# Patient Record
Sex: Male | Born: 1965 | Hispanic: No | Marital: Married | Smoking: Former smoker
Health system: Southern US, Community
[De-identification: ages and names within clinical notes are randomized; demographics above are authoritative.]

## PROBLEM LIST (undated history)

## (undated) DIAGNOSIS — K649 Unspecified hemorrhoids: Secondary | ICD-10-CM

## (undated) DIAGNOSIS — K635 Polyp of colon: Secondary | ICD-10-CM

## (undated) DIAGNOSIS — M1990 Unspecified osteoarthritis, unspecified site: Secondary | ICD-10-CM

## (undated) HISTORY — DX: Polyp of colon: K63.5

## (undated) HISTORY — DX: Unspecified hemorrhoids: K64.9

---

## 2005-12-09 HISTORY — PX: BACK SURGERY: SHX140

## 2019-11-17 LAB — HEPATIC FUNCTION PANEL
ALT: 29 (ref 10–40)
AST: 29 (ref 14–40)
Alkaline Phosphatase: 54 (ref 25–125)
Bilirubin, Total: 0.4

## 2019-11-17 LAB — BASIC METABOLIC PANEL
BUN: 33 — AB (ref 4–21)
CO2: 28 — AB (ref 13–22)
Chloride: 103 (ref 99–108)
Creatinine: 0.9 (ref 0.6–1.3)
Glucose: 99
Potassium: 4.1 (ref 3.4–5.3)
Sodium: 138 (ref 137–147)

## 2019-11-17 LAB — TESTOSTERONE: Testosterone: 601

## 2019-11-17 LAB — CBC AND DIFFERENTIAL
HCT: 38 — AB (ref 41–53)
Hemoglobin: 13.1 — AB (ref 13.5–17.5)
Neutrophils Absolute: 5
Platelets: 259 (ref 150–399)
WBC: 7

## 2019-11-17 LAB — COMPREHENSIVE METABOLIC PANEL
Albumin: 4.1 (ref 3.5–5.0)
Calcium: 9.1 (ref 8.7–10.7)

## 2019-11-17 LAB — CBC: RBC: 40.5 — AB (ref 3.87–5.11)

## 2020-02-25 ENCOUNTER — Ambulatory Visit: Payer: Self-pay | Admitting: Family Medicine

## 2020-03-06 ENCOUNTER — Other Ambulatory Visit: Payer: Self-pay

## 2020-03-07 ENCOUNTER — Encounter: Payer: Self-pay | Admitting: Family Medicine

## 2020-03-07 ENCOUNTER — Encounter: Payer: Self-pay | Admitting: Gastroenterology

## 2020-03-07 ENCOUNTER — Ambulatory Visit (INDEPENDENT_AMBULATORY_CARE_PROVIDER_SITE_OTHER): Payer: 59 | Admitting: Family Medicine

## 2020-03-07 VITALS — BP 118/70 | HR 76 | Temp 97.4°F | Ht 70.0 in | Wt 213.4 lb

## 2020-03-07 DIAGNOSIS — Z0000 Encounter for general adult medical examination without abnormal findings: Secondary | ICD-10-CM | POA: Diagnosis not present

## 2020-03-07 DIAGNOSIS — N529 Male erectile dysfunction, unspecified: Secondary | ICD-10-CM | POA: Insufficient documentation

## 2020-03-07 DIAGNOSIS — E291 Testicular hypofunction: Secondary | ICD-10-CM

## 2020-03-07 DIAGNOSIS — N5239 Other post-surgical erectile dysfunction: Secondary | ICD-10-CM

## 2020-03-07 DIAGNOSIS — R150 Incomplete defecation: Secondary | ICD-10-CM | POA: Diagnosis not present

## 2020-03-07 DIAGNOSIS — K649 Unspecified hemorrhoids: Secondary | ICD-10-CM | POA: Diagnosis not present

## 2020-03-07 DIAGNOSIS — Z1211 Encounter for screening for malignant neoplasm of colon: Secondary | ICD-10-CM | POA: Insufficient documentation

## 2020-03-07 MED ORDER — SILDENAFIL CITRATE 100 MG PO TABS: 50.0000 mg | ORAL_TABLET | Freq: Every day | ORAL | 5 refills | Status: DC | PRN

## 2020-03-07 MED ORDER — SILDENAFIL CITRATE 100 MG PO TABS: 50.0000 mg | ORAL_TABLET | Freq: Every day | ORAL | 11 refills | Status: DC | PRN

## 2020-03-07 NOTE — Addendum Note (Signed)
Addended by: Lynda Rainwater on: 03/07/2020 02:07 PM   Modules accepted: Orders

## 2020-03-07 NOTE — Patient Instructions (Signed)
Health Maintenance, Male Adopting a healthy lifestyle and getting preventive care are important in promoting health and wellness. Ask your health care provider about:  The right schedule for you to have regular tests and exams.  Things you can do on your own to prevent diseases and keep yourself healthy. What should I know about diet, weight, and exercise? Eat a healthy diet   Eat a diet that includes plenty of vegetables, fruits, low-fat dairy products, and lean protein.  Do not eat a lot of foods that are high in solid fats, added sugars, or sodium. Maintain a healthy weight Body mass index (BMI) is a measurement that can be used to identify possible weight problems. It estimates body fat based on height and weight. Your health care provider can help determine your BMI and help you achieve or maintain a healthy weight. Get regular exercise Get regular exercise. This is one of the most important things you can do for your health. Most adults should:  Exercise for at least 150 minutes each week. The exercise should increase your heart rate and make you sweat (moderate-intensity exercise).  Do strengthening exercises at least twice a week. This is in addition to the moderate-intensity exercise.  Spend less time sitting. Even light physical activity can be beneficial. Watch cholesterol and blood lipids Have your blood tested for lipids and cholesterol at 56 years of age, then have this test every 5 years. You may need to have your cholesterol levels checked more often if:  Your lipid or cholesterol levels are high.  You are older than 56 years of age.  You are at high risk for heart disease. What should I know about cancer screening? Many types of cancers can be detected early and may often be prevented. Depending on your health history and family history, you may need to have cancer screening at various ages. This may include screening for:  Colorectal cancer.  Prostate  cancer.  Skin cancer.  Lung cancer. What should I know about heart disease, diabetes, and high blood pressure? Blood pressure and heart disease  High blood pressure causes heart disease and increases the risk of stroke. This is more likely to develop in people who have high blood pressure readings, are of African descent, or are overweight.  Talk with your health care provider about your target blood pressure readings.  Have your blood pressure checked: ? Every 3-5 years if you are 18-39 years of age. ? Every year if you are 40 years old or older.  If you are between the ages of 65 and 75 and are a current or former smoker, ask your health care provider if you should have a one-time screening for abdominal aortic aneurysm (AAA). Diabetes Have regular diabetes screenings. This checks your fasting blood sugar level. Have the screening done:  Once every three years after age 45 if you are at a normal weight and have a low risk for diabetes.  More often and at a younger age if you are overweight or have a high risk for diabetes. What should I know about preventing infection? Hepatitis B If you have a higher risk for hepatitis B, you should be screened for this virus. Talk with your health care provider to find out if you are at risk for hepatitis B infection. Hepatitis C Blood testing is recommended for:  Everyone born from 1945 through 1965.  Anyone with known risk factors for hepatitis C. Sexually transmitted infections (STIs)  You should be screened each year   for STIs, including gonorrhea and chlamydia, if: ? You are sexually active and are younger than 56 years of age. ? You are older than 56 years of age and your health care provider tells you that you are at risk for this type of infection. ? Your sexual activity has changed since you were last screened, and you are at increased risk for chlamydia or gonorrhea. Ask your health care provider if you are at risk.  Ask your  health care provider about whether you are at high risk for HIV. Your health care provider may recommend a prescription medicine to help prevent HIV infection. If you choose to take medicine to prevent HIV, you should first get tested for HIV. You should then be tested every 3 months for as long as you are taking the medicine. Follow these instructions at home: Lifestyle  Do not use any products that contain nicotine or tobacco, such as cigarettes, e-cigarettes, and chewing tobacco. If you need help quitting, ask your health care provider.  Do not use street drugs.  Do not share needles.  Ask your health care provider for help if you need support or information about quitting drugs. Alcohol use  Do not drink alcohol if your health care provider tells you not to drink.  If you drink alcohol: ? Limit how much you have to 0-2 drinks a day. ? Be aware of how much alcohol is in your drink. In the [place] one drink equals one 12 oz bottle of beer (355 mL), one 5 oz glass of wine (148 mL), or one 1 oz glass of hard liquor (44 mL). General instructions  Schedule regular health, dental, and eye exams.  Stay current with your vaccines.  Tell your health care provider if: ? You often feel depressed. ? You have ever been abused or do not feel safe at home. Summary  Adopting a healthy lifestyle and getting preventive care are important in promoting health and wellness.  Follow your health care provider's instructions about healthy diet, exercising, and getting tested or screened for diseases.  Follow your health care provider's instructions on monitoring your cholesterol and blood pressure. This information is not intended to replace advice given to you by your health care provider. Make sure you discuss any questions you have with your health care provider. Document Revised: 11/18/2018 Document Reviewed: 11/18/2018 Elsevier Patient Education  2020 Elsevier Inc.  Preventive Care 40-64 Years  Old, Male Preventive care refers to lifestyle choices and visits with your health care provider that can promote health and wellness. This includes:  A yearly physical exam. This is also called an annual well check.  Regular dental and eye exams.  Immunizations.  Screening for certain conditions.  Healthy lifestyle choices, such as eating a healthy diet, getting regular exercise, not using drugs or products that contain nicotine and tobacco, and limiting alcohol use. What can I expect for my preventive care visit? Physical exam Your health care provider will check:  Height and weight. These may be used to calculate body mass index (BMI), which is a measurement that tells if you are at a healthy weight.  Heart rate and blood pressure.  Your skin for abnormal spots. Counseling Your health care provider may ask you questions about:  Alcohol, tobacco, and drug use.  Emotional well-being.  Home and relationship well-being.  Sexual activity.  Eating habits.  Work and work environment. What immunizations do I need?  Influenza (flu) vaccine  This is recommended every year. Tetanus, diphtheria,   and pertussis (Tdap) vaccine  You may need a Td booster every 10 years. Varicella (chickenpox) vaccine  You may need this vaccine if you have not already been vaccinated. Zoster (shingles) vaccine  You may need this after age 63. Measles, mumps, and rubella (MMR) vaccine  You may need at least one dose of MMR if you were born in 1957 or later. You may also need a second dose. Pneumococcal conjugate (PCV13) vaccine  You may need this if you have certain conditions and were not previously vaccinated. Pneumococcal polysaccharide (PPSV23) vaccine  You may need one or two doses if you smoke cigarettes or if you have certain conditions. Meningococcal conjugate (MenACWY) vaccine  You may need this if you have certain conditions. Hepatitis A vaccine  You may need this if you have  certain conditions or if you travel or work in places where you may be exposed to hepatitis A. Hepatitis B vaccine  You may need this if you have certain conditions or if you travel or work in places where you may be exposed to hepatitis B. Haemophilus influenzae type b (Hib) vaccine  You may need this if you have certain risk factors. Human papillomavirus (HPV) vaccine  If recommended by your health care provider, you may need three doses over 6 months. You may receive vaccines as individual doses or as more than one vaccine together in one shot (combination vaccines). Talk with your health care provider about the risks and benefits of combination vaccines. What tests do I need? Blood tests  Lipid and cholesterol levels. These may be checked every 5 years, or more frequently if you are over 68 years old.  Hepatitis C test.  Hepatitis B test. Screening  Lung cancer screening. You may have this screening every year starting at age 78 if you have a 30-pack-year history of smoking and currently smoke or have quit within the past 15 years.  Prostate cancer screening. Recommendations will vary depending on your family history and other risks.  Colorectal cancer screening. All adults should have this screening starting at age 38 and continuing until age 22. Your health care provider may recommend screening at age 73 if you are at increased risk. You will have tests every 1-10 years, depending on your results and the type of screening test.  Diabetes screening. This is done by checking your blood sugar (glucose) after you have not eaten for a while (fasting). You may have this done every 1-3 years.  Sexually transmitted disease (STD) testing. Follow these instructions at home: Eating and drinking  Eat a diet that includes fresh fruits and vegetables, whole grains, lean protein, and low-fat dairy products.  Take vitamin and mineral supplements as recommended by your health care  provider.  Do not drink alcohol if your health care provider tells you not to drink.  If you drink alcohol: ? Limit how much you have to 0-2 drinks a day. ? Be aware of how much alcohol is in your drink. In the [place] one drink equals one 12 oz bottle of beer (355 mL), one 5 oz glass of wine (148 mL), or one 1 oz glass of hard liquor (44 mL). Lifestyle  Take daily care of your teeth and gums.  Stay active. Exercise for at least 30 minutes on 5 or more days each week.  Do not use any products that contain nicotine or tobacco, such as cigarettes, e-cigarettes, and chewing tobacco. If you need help quitting, ask your health care provider.  If  you are sexually active, practice safe sex. Use a condom or other form of protection to prevent STIs (sexually transmitted infections).  Talk with your health care provider about taking a low-dose aspirin every day starting at age 50. What's next?  Go to your health care provider once a year for a well check visit.  Ask your health care provider how often you should have your eyes and teeth checked.  Stay up to date on all vaccines. This information is not intended to replace advice given to you by your health care provider. Make sure you discuss any questions you have with your health care provider. Document Revised: 11/19/2018 Document Reviewed: 11/19/2018 Elsevier Patient Education  2020 Elsevier Inc.  

## 2020-03-07 NOTE — Progress Notes (Signed)
New Patient Office Visit  Subjective:  Patient ID: Tyler Ferguson, male    DOB: [birth year]  Age: 54 y.o. MRN: [id number]  CC:  Chief Complaint  Patient presents with  . Establish Care    new patient refill on viagra     HPI Tyler Ferguson presents for establishment of care and a physical exam.  He is nonfasting.  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  Tells of difficulty with stooling and fecal incontinence with erectile dysfunction after back surgery some years ago.  He deals with leakage from his rectum.  It often takes him 30 minutes to have a bowel movement.  He is taking nothing for this issue.  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  History reviewed. No pertinent past medical history.  Past Surgical History:  Procedure Laterality Date  . BACK SURGERY  2007    Family History  Problem Relation Age of Onset  . Heart disease Mother   . Cancer Father   . Cancer Sister     Social History   Socioeconomic History  . Marital status: Married    Spouse name: Not on file  . Number of children: Not on file  . Years of education: Not on file  . Highest education level: Not on file  Occupational History  . Not on file  Tobacco Use  . Smoking status: Former Research scientist (life sciences)  . Smokeless tobacco: Never Used  Substance and Sexual Activity  . Alcohol use: Yes    Alcohol/week: 1.0 standard drinks    Types: 1 Cans of beer per week    Comment: social  . Drug use: Never  . Sexual activity: Not on file  Other Topics Concern  . Not on file  Social History Narrative  . Not on file   Social Determinants of Health   Financial Resource Strain:   . Difficulty of Paying Living Expenses:   Food Insecurity:   . Worried About Charity fundraiser in the Last Year:   . Arboriculturist in the Last Year:   Transportation Needs:   . Film/video editor (Medical):   Marland Kitchen Lack of Transportation (Non-Medical):   Physical Activity:   . Days of  Exercise per Week:   . Minutes of Exercise per Session:   Stress:   . Feeling of Stress :   Social Connections:   . Frequency of Communication with Friends and Family:   . Frequency of Social Gatherings with Friends and Family:   . Attends Religious Services:   . Active Member of Clubs or Organizations:   . Attends Archivist Meetings:   Marland Kitchen Marital Status:   Intimate Partner Violence:   . Fear of Current or Ex-Partner:   . Emotionally Abused:   Marland Kitchen Physically Abused:   . Sexually Abused:     ROS Review of Systems  Constitutional: Negative.   HENT: Negative.   Eyes: Negative for photophobia and visual disturbance.  Respiratory: Negative.   Cardiovascular: Negative.   Gastrointestinal: Negative.  Negative for anal bleeding and blood in stool.  Endocrine: Negative for polyphagia and polyuria.  Genitourinary: Negative for difficulty urinating, frequency and urgency.  Musculoskeletal: Negative for gait problem and joint swelling.  Skin: Negative for pallor and rash.  Allergic/Immunologic: Negative for immunocompromised state.  Neurological: Negative for speech difficulty.  Hematological: Does not bruise/bleed easily.  Psychiatric/Behavioral: Negative.     Objective:   Today's Vitals: BP 118/70  Pulse 76   Temp (!) 97.4 F (36.3 C) (Tympanic)   Ht 5\' 10"  (1.778 m)   Wt 213 lb 6.4 oz (96.8 kg)   SpO2 94%   BMI 30.62 kg/m   Physical Exam Vitals and nursing note reviewed.  Constitutional:      General: He is not in acute distress.    Appearance: Normal appearance. He is normal weight. He is not ill-appearing, toxic-appearing or diaphoretic.  HENT:     Head: Normocephalic and atraumatic.     Right Ear: Tympanic membrane, ear canal and external ear normal. There is no impacted cerumen.     Left Ear: Tympanic membrane, ear canal and external ear normal. There is no impacted cerumen.  Eyes:     General: No scleral icterus.       Right eye: No discharge.         Left eye: No discharge.     Extraocular Movements: Extraocular movements intact.     Conjunctiva/sclera: Conjunctivae normal.     Pupils: Pupils are equal, round, and reactive to light.  Cardiovascular:     Rate and Rhythm: Normal rate and regular rhythm.  Pulmonary:     Effort: Pulmonary effort is normal.     Breath sounds: Normal breath sounds.  Abdominal:     General: Abdomen is flat. There is no distension.     Palpations: Abdomen is soft. There is no mass.     Tenderness: There is no abdominal tenderness. There is no guarding or rebound.     Hernia: No hernia is present. There is no hernia in the left inguinal area or right inguinal area.  Genitourinary:    Penis: Circumcised. No hypospadias, erythema, tenderness, discharge, swelling or lesions.      Testes:        Right: Mass, tenderness or swelling not present. Right testis is descended. Cremasteric reflex is present.         Left: Mass, tenderness or swelling not present. Left testis is descended. Cremasteric reflex is present.      Prostate: Enlarged. Not tender and no nodules present.     Rectum: Guaiac result negative. External hemorrhoid present. No tenderness or anal fissure. Abnormal anal tone.  Musculoskeletal:        General: Normal range of motion.     Cervical back: No rigidity or tenderness.  Lymphadenopathy:     Cervical: No cervical adenopathy.     Lower Body: No right inguinal adenopathy. No left inguinal adenopathy.  Skin:    General: Skin is warm and dry.  Neurological:     Mental Status: He is alert and oriented to person, place, and time.  Psychiatric:        Behavior: Behavior normal.     Assessment & Plan:   Problem List Items Addressed This Visit      Cardiovascular and Mediastinum   Hemorrhoids   Relevant Medications   sildenafil (VIAGRA) 100 MG tablet   Other Relevant Orders   Ambulatory referral to Gastroenterology     Endocrine   Androgen deficiency - Primary   Relevant Orders    Testosterone     Other   Incomplete defecation   Relevant Orders   Ambulatory referral to Gastroenterology   Healthcare maintenance   Relevant Orders   Comprehensive metabolic panel   CBC   LDL cholesterol, direct   Lipid panel   PSA   Urinalysis, Routine w reflex microscopic   Erectile dysfunction   Relevant Medications  sildenafil (VIAGRA) 100 MG tablet      Outpatient Encounter Medications as of 03/07/2020  Medication Sig  . Amino Acids (AMINO ACID PO) Take by mouth.  . beta carotene 15 MG capsule Take 15 mg by mouth daily.  Marland Kitchen CREATINE MONOHYDRATE PO Take by mouth.  . Multiple Vitamin (MULTIVITAMIN) capsule Take 1 capsule by mouth daily.  . Omega-3 1000 MG CAPS Take 1 mg by mouth daily.  Marland Kitchen testosterone cypionate (DEPOTESTOTERONE CYPIONATE) 100 MG/ML injection Inject 100 mg into the muscle every 14 (fourteen) days.  . sildenafil (VIAGRA) 100 MG tablet Take 0.5-1 tablets (50-100 mg total) by mouth daily as needed for erectile dysfunction.   No facility-administered encounter medications on file as of 03/07/2020.    Follow-up: Return in about 3 months (around 06/07/2020), or return fasting for blood work one week after your next injection..  Patient was given information on health maintenance and disease prevention.  Expressed my concern about creatine use in the kidneys.  Will send for GI consultation for hemorrhoids, question of fecal incontinence and difficulty with stooling.  He will need a colonoscopy at some point as well.  Will return for above ordered fasting blood work 1 week after his next testosterone injection.  [name], MD

## 2020-03-09 ENCOUNTER — Telehealth: Payer: Self-pay | Admitting: Family Medicine

## 2020-03-09 NOTE — Telephone Encounter (Signed)
Pt dropped off lab work from his old office from December 2020 and He wanted to let Tequila know hes taking Beta Analine 3.27 grams daily before workout

## 2020-03-15 ENCOUNTER — Ambulatory Visit: Payer: 59

## 2020-03-15 ENCOUNTER — Other Ambulatory Visit: Payer: Self-pay

## 2020-03-15 DIAGNOSIS — E291 Testicular hypofunction: Secondary | ICD-10-CM

## 2020-03-15 MED ORDER — TESTOSTERONE CYPIONATE 100 MG/ML IM SOLN
100.0000 mg | INTRAMUSCULAR | Status: DC
  Administered 2020-03-15: 100 mg via INTRAMUSCULAR

## 2020-03-15 NOTE — Patient Instructions (Signed)
Health Maintenance Due  Topic Date Due  . Hepatitis C Screening  Never done  . HIV Screening  Never done  . COLONOSCOPY  Never done    Depression screen [facility] 2/9 03/07/2020 03/07/2020  Decreased Interest 0 0  Down, Depressed, Hopeless 0 0  PHQ - 2 Score 0 0  Altered sleeping 0 -  Tired, decreased energy 0 -  Change in appetite 0 -  Feeling bad or failure about yourself  0 -  Trouble concentrating 0 -  Moving slowly or fidgety/restless 0 -  Suicidal thoughts 0 -  PHQ-9 Score 0 -

## 2020-03-15 NOTE — Progress Notes (Addendum)
Patient here for bi weekly  Testosterone injection.  Verbal order given by PCP,  IM injection given in Lt Ventrogluteal. Pt tolerated it well. Next appointment on 03/29/2020.  I have collaborated with the care management provider regarding care management and care coordination activities outlined in this encounter and have reviewed this encounter including documentation in the note and care plan. I am certifying that I agree with the content of this note and encounter as supervising physician.

## 2020-03-21 ENCOUNTER — Other Ambulatory Visit: Payer: Self-pay

## 2020-03-22 ENCOUNTER — Other Ambulatory Visit (INDEPENDENT_AMBULATORY_CARE_PROVIDER_SITE_OTHER): Payer: 59

## 2020-03-22 DIAGNOSIS — E291 Testicular hypofunction: Secondary | ICD-10-CM | POA: Diagnosis not present

## 2020-03-22 DIAGNOSIS — Z0000 Encounter for general adult medical examination without abnormal findings: Secondary | ICD-10-CM | POA: Diagnosis not present

## 2020-03-22 LAB — CBC
HCT: 40.6 % (ref 39.0–52.0)
Hemoglobin: 13.6 g/dL (ref 13.0–17.0)
MCHC: 33.4 g/dL (ref 30.0–36.0)
MCV: 93.9 fl (ref 78.0–100.0)
Platelets: 215 10*3/uL (ref 150.0–400.0)
RBC: 4.33 Mil/uL (ref 4.22–5.81)
RDW: 13.1 % (ref 11.5–15.5)
WBC: 4.2 10*3/uL (ref 4.0–10.5)

## 2020-03-22 LAB — LIPID PANEL
Cholesterol: 172 mg/dL (ref 0–200)
HDL: 52.6 mg/dL (ref >39.00)
LDL Cholesterol: 106 mg/dL — ABNORMAL HIGH (ref 0–99)
NonHDL: 119.56
Total CHOL/HDL Ratio: 3
Triglycerides: 66 mg/dL (ref 0.0–149.0)
VLDL: 13.2 mg/dL (ref 0.0–40.0)

## 2020-03-22 LAB — LDL CHOLESTEROL, DIRECT: Direct LDL: 94 mg/dL

## 2020-03-22 LAB — COMPREHENSIVE METABOLIC PANEL
ALT: 24 U/L (ref 0–53)
AST: 23 U/L (ref 0–37)
Albumin: 4.3 g/dL (ref 3.5–5.2)
Alkaline Phosphatase: 55 U/L (ref 39–117)
BUN: 22 mg/dL (ref 6–23)
CO2: 30 mEq/L (ref 19–32)
Calcium: 9.2 mg/dL (ref 8.4–10.5)
Chloride: 102 mEq/L (ref 96–112)
Creatinine, Ser: 0.89 mg/dL (ref 0.40–1.50)
GFR: 88.39 mL/min (ref >60.00)
Glucose, Bld: 90 mg/dL (ref 70–99)
Potassium: 4.3 mEq/L (ref 3.5–5.1)
Sodium: 138 mEq/L (ref 135–145)
Total Bilirubin: 0.4 mg/dL (ref 0.2–1.2)
Total Protein: 6.7 g/dL (ref 6.0–8.3)

## 2020-03-22 LAB — PSA: PSA: 0.58 ng/mL (ref 0.10–4.00)

## 2020-03-22 LAB — TESTOSTERONE: Testosterone: 346.94 ng/dL (ref 300.00–890.00)

## 2020-03-29 ENCOUNTER — Telehealth: Payer: Self-pay | Admitting: Family Medicine

## 2020-03-29 ENCOUNTER — Ambulatory Visit: Payer: 59

## 2020-03-29 DIAGNOSIS — E291 Testicular hypofunction: Secondary | ICD-10-CM

## 2020-03-29 NOTE — Telephone Encounter (Signed)
Patient is calling and stated that he washed his medication and wanted to see if he can get a refill for testosterone cypionate sent to [facility]  in [place]. CB is [phone]

## 2020-03-30 ENCOUNTER — Other Ambulatory Visit: Payer: Self-pay

## 2020-03-30 MED ORDER — TESTOSTERONE CYPIONATE 100 MG/ML IM SOLN: 100.0000 mg | INTRAMUSCULAR | 0 refills | Status: DC

## 2020-03-30 NOTE — Telephone Encounter (Signed)
Spoke with patient who states that he washed and dried his medication vial and was not sure if he would be able to use it. Per patient he went to closes urgent care in [facility] they told him he could use it but it may not be effective. Patient aware that medication can not be refilled at this time. He states that he will call and schedule virtual visit to see if he could have a refill when he returns.

## 2020-03-30 NOTE — Telephone Encounter (Signed)
Pt calling again and wanted to know if it could be sent in today

## 2020-04-06 ENCOUNTER — Ambulatory Visit: Payer: 59 | Admitting: Gastroenterology

## 2020-04-07 NOTE — Telephone Encounter (Signed)
Patient is calling and stated that he is back in town and if his testerone medication can be called into [facility]  on [address]. CB is [phone]

## 2020-04-10 MED ORDER — TESTOSTERONE CYPIONATE 100 MG/ML IM SOLN: 100.0000 mg | INTRAMUSCULAR | 0 refills | Status: DC

## 2020-04-10 NOTE — Telephone Encounter (Signed)
Patient is calling back to check status of medication refill. CB is [phone].

## 2020-04-10 NOTE — Telephone Encounter (Signed)
Returned patients call no answer, LMTCB 

## 2020-04-11 ENCOUNTER — Other Ambulatory Visit: Payer: Self-pay

## 2020-04-11 NOTE — Telephone Encounter (Signed)
Patient is calling and stated that in order for him to get his prescription Dr. Ethelene Hal had to call and ok medication because it is a control substance. Pls advise. [phone]

## 2020-04-11 NOTE — Telephone Encounter (Signed)
Patient is returning the call. CB is [phone]

## 2020-04-11 NOTE — Telephone Encounter (Signed)
Spoke with patient who verbally understood Rx sent to pharmacy and he will need to pay out of pocket cost due to insurance not covering until July.

## 2020-04-11 NOTE — Telephone Encounter (Signed)
Refill sent in by Dr. Ethelene Hal yesterday, spoke with the pharmacist who verbally understood Dr. Ethelene Hal sent in the refill yesterday and was aware that patient hand made a mistake and washed medication. Patient also aware that a one time refill was sent in. Pharmacist will fill for patient.

## 2020-04-12 ENCOUNTER — Ambulatory Visit (INDEPENDENT_AMBULATORY_CARE_PROVIDER_SITE_OTHER): Payer: 59

## 2020-04-12 DIAGNOSIS — E291 Testicular hypofunction: Secondary | ICD-10-CM | POA: Diagnosis not present

## 2020-04-12 MED ORDER — TESTOSTERONE CYPIONATE 100 MG/ML IM SOLN
100.0000 mg | INTRAMUSCULAR | Status: DC
  Administered 2020-04-12: 100 mg via INTRAMUSCULAR

## 2020-04-12 NOTE — Progress Notes (Signed)
After obtaining consent, and per orders of Dr. Ethelene Hal, injection of Testosterone 39mmg/1mL given in right UOQ by Adlai Nieblas Berneta Sages. Patient instructed to remain in clinic for 20 minutes afterwards, and to report any adverse reaction to me immediately.

## 2020-04-14 ENCOUNTER — Other Ambulatory Visit: Payer: 59

## 2020-05-02 ENCOUNTER — Ambulatory Visit: Payer: 59 | Admitting: Gastroenterology

## 2020-05-23 ENCOUNTER — Other Ambulatory Visit: Payer: Self-pay

## 2020-05-23 ENCOUNTER — Ambulatory Visit: Payer: 59

## 2020-05-24 ENCOUNTER — Ambulatory Visit (INDEPENDENT_AMBULATORY_CARE_PROVIDER_SITE_OTHER): Payer: 59

## 2020-05-24 DIAGNOSIS — E291 Testicular hypofunction: Secondary | ICD-10-CM | POA: Diagnosis not present

## 2020-05-24 MED ORDER — TESTOSTERONE CYPIONATE 100 MG/ML IM SOLN
100.0000 mg | INTRAMUSCULAR | Status: DC
  Administered 2020-05-24 – 2020-07-05 (×2): 100 mg via INTRAMUSCULAR

## 2020-05-24 NOTE — Patient Instructions (Signed)
..  After obtaining consent, and per orders of Dr. Ethelene Hal, injection of testerone given by Lynda Rainwater. Patient instructed to remain in clinic for 20 minutes afterwards, and to report any adverse reaction to me immediately.

## 2020-06-06 ENCOUNTER — Encounter: Payer: Self-pay | Admitting: Family Medicine

## 2020-06-06 ENCOUNTER — Ambulatory Visit: Payer: 59 | Admitting: Family Medicine

## 2020-06-08 ENCOUNTER — Ambulatory Visit: Payer: 59 | Admitting: Gastroenterology

## 2020-06-14 ENCOUNTER — Encounter: Payer: Self-pay | Admitting: Family Medicine

## 2020-06-28 ENCOUNTER — Other Ambulatory Visit: Payer: Self-pay

## 2020-06-29 ENCOUNTER — Telehealth: Payer: Self-pay | Admitting: Family Medicine

## 2020-06-29 ENCOUNTER — Ambulatory Visit: Payer: 59 | Admitting: Family Medicine

## 2020-06-29 ENCOUNTER — Encounter: Payer: Self-pay | Admitting: Family Medicine

## 2020-06-29 VITALS — BP 122/78 | HR 58 | Temp 97.9°F | Ht 70.0 in | Wt 218.0 lb

## 2020-06-29 DIAGNOSIS — Z1211 Encounter for screening for malignant neoplasm of colon: Secondary | ICD-10-CM | POA: Diagnosis not present

## 2020-06-29 DIAGNOSIS — E291 Testicular hypofunction: Secondary | ICD-10-CM

## 2020-06-29 DIAGNOSIS — Z0000 Encounter for general adult medical examination without abnormal findings: Secondary | ICD-10-CM | POA: Diagnosis not present

## 2020-06-29 DIAGNOSIS — N5239 Other post-surgical erectile dysfunction: Secondary | ICD-10-CM

## 2020-06-29 LAB — CBC
HCT: 38.6 % — ABNORMAL LOW (ref 39.0–52.0)
Hemoglobin: 13 g/dL (ref 13.0–17.0)
MCHC: 33.6 g/dL (ref 30.0–36.0)
MCV: 93.7 fl (ref 78.0–100.0)
Platelets: 238 10*3/uL (ref 150.0–400.0)
RBC: 4.12 Mil/uL — ABNORMAL LOW (ref 4.22–5.81)
RDW: 13 % (ref 11.5–15.5)
WBC: 3.8 10*3/uL — ABNORMAL LOW (ref 4.0–10.5)

## 2020-06-29 LAB — URINALYSIS, ROUTINE W REFLEX MICROSCOPIC
Bilirubin Urine: NEGATIVE
Hgb urine dipstick: NEGATIVE
Ketones, ur: NEGATIVE
Leukocytes,Ua: NEGATIVE
Nitrite: NEGATIVE
RBC / HPF: NONE SEEN (ref 0–?)
Specific Gravity, Urine: <=1.005 — AB (ref 1.000–1.030)
Total Protein, Urine: NEGATIVE
Urine Glucose: NEGATIVE
Urobilinogen, UA: 0.2 (ref 0.0–1.0)
WBC, UA: NONE SEEN (ref 0–?)
pH: 6 (ref 5.0–8.0)

## 2020-06-29 MED ORDER — SILDENAFIL CITRATE 100 MG PO TABS: 50.0000 mg | ORAL_TABLET | Freq: Every day | ORAL | 5 refills | Status: DC | PRN

## 2020-06-29 NOTE — Telephone Encounter (Signed)
Patient is calling to see what his last Hemoglobin A1C was. Please give him a call back at [phone].

## 2020-06-29 NOTE — Progress Notes (Addendum)
Established Patient Office Visit  Subjective:  Patient ID: Tyler Ferguson, male    DOB: Mar 26, 1966  Age: 54 y.o. MRN: [id number]  CC:  Chief Complaint  Patient presents with  . Follow-up    3 month follow up    HPI Tyler Ferguson presents for follow-up of testosterone therapy.  No issues with the injection.  Last injection was 5 days ago.  Seems to have helped his energy levels.  Asking for refills of Revatio.  Continues to deal with constipation and leakage of stool.  Has been an ongoing issue since her back surgery some years ago.  It is time for him to have a colonoscopy.  History reviewed. No pertinent past medical history.  Past Surgical History:  Procedure Laterality Date  . BACK SURGERY  2007    Family History  Problem Relation Age of Onset  . Heart disease Mother   . Cancer Father   . Cancer Sister     Social History   Socioeconomic History  . Marital status: Married    Spouse name: Not on file  . Number of children: Not on file  . Years of education: Not on file  . Highest education level: Not on file  Occupational History  . Not on file  Tobacco Use  . Smoking status: Former Research scientist (life sciences)  . Smokeless tobacco: Never Used  Vaping Use  . Vaping Use: Never used  Substance and Sexual Activity  . Alcohol use: Yes    Alcohol/week: 1.0 standard drink    Types: 1 Cans of beer per week    Comment: social  . Drug use: Never  . Sexual activity: Not on file  Other Topics Concern  . Not on file  Social History Narrative  . Not on file   Social Determinants of Health   Financial Resource Strain:   . Difficulty of Paying Living Expenses:   Food Insecurity:   . Worried About Charity fundraiser in the Last Year:   . Arboriculturist in the Last Year:   Transportation Needs:   . Film/video editor (Medical):   Marland Kitchen Lack of Transportation (Non-Medical):   Physical Activity:   . Days of Exercise per Week:   . Minutes of Exercise per Session:   Stress:   .  Feeling of Stress :   Social Connections:   . Frequency of Communication with Friends and Family:   . Frequency of Social Gatherings with Friends and Family:   . Attends Religious Services:   . Active Member of Clubs or Organizations:   . Attends Archivist Meetings:   Marland Kitchen Marital Status:   Intimate Partner Violence:   . Fear of Current or Ex-Partner:   . Emotionally Abused:   Marland Kitchen Physically Abused:   . Sexually Abused:     Outpatient Medications Prior to Visit  Medication Sig Dispense Refill  . Amino Acids (AMINO ACID PO) Take by mouth.    Marland Kitchen CREATINE MONOHYDRATE PO Take by mouth.    . Multiple Vitamin (MULTIVITAMIN) capsule Take 1 capsule by mouth daily.    . Omega-3 1000 MG CAPS Take 1 mg by mouth daily.    Marland Kitchen testosterone cypionate (DEPOTESTOTERONE CYPIONATE) 100 MG/ML injection Inject 1 mL (100 mg total) into the muscle every 14 (fourteen) days. For IM use only 10 mL 0  . sildenafil (VIAGRA) 100 MG tablet Take 0.5-1 tablets (50-100 mg total) by mouth daily as needed for erectile dysfunction. 20 tablet 5  Facility-Administered Medications Prior to Visit  Medication Dose Route Frequency Provider Last Rate Last Admin  . testosterone cypionate (DEPOTESTOTERONE CYPIONATE) injection 100 mg  100 mg Intramuscular Q14 Days [name], MD   100 mg at 03/15/20 1151  . testosterone cypionate (DEPOTESTOTERONE CYPIONATE) injection 100 mg  100 mg Intramuscular Q14 Days [name], MD   100 mg at 04/12/20 0845  . testosterone cypionate (DEPOTESTOTERONE CYPIONATE) injection 100 mg  100 mg Intramuscular Q14 Days [name], MD   100 mg at 05/24/20 [id number]    No Known Allergies  ROS Review of Systems  Constitutional: Negative.   Respiratory: Negative.   Cardiovascular: Negative.   Gastrointestinal: Positive for constipation and diarrhea.  Genitourinary: Negative.   Musculoskeletal: Negative for gait problem and joint swelling.  Psychiatric/Behavioral:  Negative.       Objective:    Physical Exam Vitals and nursing note reviewed.  Constitutional:      General: He is not in acute distress.    Appearance: Normal appearance. He is not ill-appearing, toxic-appearing or diaphoretic.  HENT:     Head: Normocephalic and atraumatic.     Right Ear: External ear normal.     Left Ear: External ear normal.  Eyes:     General:        Right eye: No discharge.        Left eye: No discharge.     Conjunctiva/sclera: Conjunctivae normal.  Pulmonary:     Effort: Pulmonary effort is normal.  Skin:    General: Skin is warm and dry.  Neurological:     General: No focal deficit present.     Mental Status: He is alert and oriented to person, place, and time.  Psychiatric:        Mood and Affect: Mood normal.        Behavior: Behavior normal.     BP 122/78 (BP Location: Left Arm, Patient Position: Sitting, Cuff Size: Normal)   Pulse (!) 58   Temp 97.9 F (36.6 C) (Oral)   Ht 5\' 10"  (1.778 m)   Wt 218 lb (98.9 kg)   SpO2 94%   BMI 31.28 kg/m  Wt Readings from Last 3 Encounters:  06/29/20 218 lb (98.9 kg)  03/07/20 213 lb 6.4 oz (96.8 kg)     Health Maintenance Due  Topic Date Due  . Hepatitis C Screening  Never done  . COVID-19 Vaccine (1) Never done  . HIV Screening  Never done  . COLONOSCOPY  Never done    There are no preventive care reminders to display for this patient.  No results found for: TSH Lab Results  Component Value Date   WBC 3.8 (L) 06/29/2020   HGB 13.0 06/29/2020   HCT 38.6 (L) 06/29/2020   MCV 93.7 06/29/2020   PLT 238.0 06/29/2020   Lab Results  Component Value Date   NA 138 03/22/2020   K 4.3 03/22/2020   CO2 30 03/22/2020   GLUCOSE 90 03/22/2020   BUN 22 03/22/2020   CREATININE 0.89 03/22/2020   BILITOT 0.4 03/22/2020   ALKPHOS 55 03/22/2020   AST 23 03/22/2020   ALT 24 03/22/2020   PROT 6.7 03/22/2020   ALBUMIN 4.3 03/22/2020   CALCIUM 9.2 03/22/2020   GFR 88.39 03/22/2020   Lab Results   Component Value Date   CHOL 172 03/22/2020   Lab Results  Component Value Date   HDL 52.60 03/22/2020   Lab Results  Component Value Date  Bayard 106 (H) 03/22/2020   Lab Results  Component Value Date   TRIG 66.0 03/22/2020   Lab Results  Component Value Date   CHOLHDL 3 03/22/2020   No results found for: HGBA1C    Assessment & Plan:   Problem List Items Addressed This Visit      Endocrine   Androgen deficiency   Relevant Orders   CBC (Completed)   Testosterone     Other   Healthcare maintenance   Relevant Orders   Urinalysis, Routine w reflex microscopic (Completed)   Erectile dysfunction   Relevant Medications   sildenafil (VIAGRA) 100 MG tablet    Other Visit Diagnoses    Colon cancer screening    -  Primary   Relevant Orders   Ambulatory referral to Gastroenterology      Meds ordered this encounter  Medications  . sildenafil (VIAGRA) 100 MG tablet    Sig: Take 0.5-1 tablets (50-100 mg total) by mouth daily as needed for erectile dysfunction.    Dispense:  20 tablet    Refill:  5    Follow-up: Return in about 1 year (around 06/29/2021), or if symptoms worsen or fail to improve.   Suggested that he become a [employer] donor.  He will consider it. [name], MD

## 2020-06-29 NOTE — Telephone Encounter (Signed)
Called patient and informed him of requested readings.

## 2020-06-30 NOTE — Addendum Note (Signed)
Addended by: Jon Billings on: 06/30/2020 08:18 AM   Modules accepted: Orders

## 2020-06-30 NOTE — Addendum Note (Signed)
Addended by: Abelino Derrick A on: 06/30/2020 08:00 AM   Modules accepted: Orders

## 2020-07-05 ENCOUNTER — Ambulatory Visit (INDEPENDENT_AMBULATORY_CARE_PROVIDER_SITE_OTHER): Payer: 59

## 2020-07-05 ENCOUNTER — Other Ambulatory Visit: Payer: Self-pay

## 2020-07-05 DIAGNOSIS — E291 Testicular hypofunction: Secondary | ICD-10-CM

## 2020-07-05 NOTE — Progress Notes (Signed)
..  After obtaining consent, and per orders of Dr. Ethelene Hal , injection of testosterone given by Lynda Rainwater right glut. Patient instructed to remain in clinic for 20 minutes afterwards, and to report any adverse reaction to me immediately.

## 2020-07-05 NOTE — Progress Notes (Signed)
I reviewed and agree with the documentation and plan as outlined below.   

## 2020-08-08 ENCOUNTER — Other Ambulatory Visit: Payer: Self-pay

## 2020-08-09 ENCOUNTER — Ambulatory Visit: Payer: 59 | Admitting: Family Medicine

## 2020-08-09 ENCOUNTER — Encounter: Payer: Self-pay | Admitting: Family Medicine

## 2020-08-09 ENCOUNTER — Other Ambulatory Visit: Payer: Self-pay | Admitting: Family Medicine

## 2020-08-09 VITALS — BP 122/64 | HR 63 | Temp 97.2°F | Ht 70.0 in | Wt 222.2 lb

## 2020-08-09 DIAGNOSIS — E291 Testicular hypofunction: Secondary | ICD-10-CM

## 2020-08-09 DIAGNOSIS — L578 Other skin changes due to chronic exposure to nonionizing radiation: Secondary | ICD-10-CM | POA: Insufficient documentation

## 2020-08-09 LAB — TESTOSTERONE: Testosterone: 370.1 ng/dL (ref 300.00–890.00)

## 2020-08-09 MED ORDER — PREDNISONE 20 MG PO TABS: 20.0000 mg | ORAL_TABLET | Freq: Two times a day (BID) | ORAL | 0 refills | Status: AC

## 2020-08-09 NOTE — Progress Notes (Addendum)
Established Patient Office Visit  Subjective:  Patient ID: Tyler Ferguson, male    DOB: [birth year]  Age: 54 y.o. MRN: [id number]  CC:  Chief Complaint  Patient presents with  . Rash    Rash on chest x 5-7days very itchy.     HPI Tyler Ferguson presents for evaluation treatment of pruritic rash on these upper back and chest.  Came about after vacationing at the beach.  His wife is not affected.  Interdigital spaces and genitals are not affected.  He is taking no new medications.  There was a good deal of sun exposure.  There was no herald or oval patch.  Last testosterone injection was 6 days ago.  History reviewed. No pertinent past medical history.  Past Surgical History:  Procedure Laterality Date  . BACK SURGERY  2007    Family History  Problem Relation Age of Onset  . Heart disease Mother   . Cancer Father   . Cancer Sister     Social History   Socioeconomic History  . Marital status: Married    Spouse name: Not on file  . Number of children: Not on file  . Years of education: Not on file  . Highest education level: Not on file  Occupational History  . Not on file  Tobacco Use  . Smoking status: Former Research scientist (life sciences)  . Smokeless tobacco: Never Used  Vaping Use  . Vaping Use: Never used  Substance and Sexual Activity  . Alcohol use: Yes    Alcohol/week: 1.0 standard drink    Types: 1 Cans of beer per week    Comment: social  . Drug use: Never  . Sexual activity: Not on file  Other Topics Concern  . Not on file  Social History Narrative  . Not on file   Social Determinants of Health   Financial Resource Strain:   . Difficulty of Paying Living Expenses: Not on file  Food Insecurity:   . Worried About Charity fundraiser in the Last Year: Not on file  . Ran Out of Food in the Last Year: Not on file  Transportation Needs:   . Lack of Transportation (Medical): Not on file  . Lack of Transportation (Non-Medical): Not on file  Physical Activity:   . Days of  Exercise per Week: Not on file  . Minutes of Exercise per Session: Not on file  Stress:   . Feeling of Stress : Not on file  Social Connections:   . Frequency of Communication with Friends and Family: Not on file  . Frequency of Social Gatherings with Friends and Family: Not on file  . Attends Religious Services: Not on file  . Active Member of Clubs or Organizations: Not on file  . Attends Archivist Meetings: Not on file  . Marital Status: Not on file  Intimate Partner Violence:   . Fear of Current or Ex-Partner: Not on file  . Emotionally Abused: Not on file  . Physically Abused: Not on file  . Sexually Abused: Not on file    Outpatient Medications Prior to Visit  Medication Sig Dispense Refill  . Amino Acids (AMINO ACID PO) Take by mouth.    Marland Kitchen CREATINE MONOHYDRATE PO Take by mouth.    . Multiple Vitamin (MULTIVITAMIN) capsule Take 1 capsule by mouth daily.    . Omega-3 1000 MG CAPS Take 1 mg by mouth daily.    . sildenafil (VIAGRA) 100 MG tablet Take 0.5-1 tablets (50-100 mg total) by  mouth daily as needed for erectile dysfunction. 20 tablet 5  . testosterone cypionate (DEPOTESTOTERONE CYPIONATE) 100 MG/ML injection Inject 1 mL (100 mg total) into the muscle every 14 (fourteen) days. For IM use only 10 mL 0  . testosterone cypionate (DEPOTESTOTERONE CYPIONATE) injection 100 mg     . testosterone cypionate (DEPOTESTOTERONE CYPIONATE) injection 100 mg     . testosterone cypionate (DEPOTESTOTERONE CYPIONATE) injection 100 mg      No facility-administered medications prior to visit.    No Known Allergies  ROS Review of Systems  Constitutional: Negative for diaphoresis, fatigue, fever and unexpected weight change.  Respiratory: Negative.   Cardiovascular: Negative.   Gastrointestinal: Negative.   Musculoskeletal: Negative for arthralgias and myalgias.  Skin: Positive for color change.  Allergic/Immunologic: Negative for immunocompromised state.  Neurological:  Negative for light-headedness and numbness.  Hematological: Does not bruise/bleed easily.  Psychiatric/Behavioral: Negative.       Objective:    Physical Exam Vitals and nursing note reviewed.  Constitutional:      General: He is not in acute distress.    Appearance: Normal appearance. He is normal weight. He is not ill-appearing, toxic-appearing or diaphoretic.  HENT:     Head: Normocephalic and atraumatic.     Right Ear: External ear normal.     Left Ear: External ear normal.  Eyes:     General: No scleral icterus.       Right eye: No discharge.        Left eye: No discharge.     Conjunctiva/sclera: Conjunctivae normal.  Pulmonary:     Effort: Pulmonary effort is normal.  Musculoskeletal:     Right lower leg: No edema.     Left lower leg: No edema.  Skin:    General: Skin is warm and dry.       Neurological:     Mental Status: He is alert and oriented to person, place, and time.  Psychiatric:        Mood and Affect: Mood normal.        Behavior: Behavior normal.     BP 122/64   Pulse 63   Temp (!) 97.2 F (36.2 C) (Tympanic)   Ht 5\' 10"  (1.778 m)   Wt 222 lb 3.2 oz (100.8 kg)   SpO2 95%   BMI 31.88 kg/m  Wt Readings from Last 3 Encounters:  08/09/20 222 lb 3.2 oz (100.8 kg)  06/29/20 218 lb (98.9 kg)  03/07/20 213 lb 6.4 oz (96.8 kg)     Health Maintenance Due  Topic Date Due  . Hepatitis C Screening  Never done  . COVID-19 Vaccine (1) Never done  . HIV Screening  Never done  . COLONOSCOPY  Never done  . INFLUENZA VACCINE  Never done    There are no preventive care reminders to display for this patient.  No results found for: TSH Lab Results  Component Value Date   WBC 3.8 (L) 06/29/2020   HGB 13.0 06/29/2020   HCT 38.6 (L) 06/29/2020   MCV 93.7 06/29/2020   PLT 238.0 06/29/2020   Lab Results  Component Value Date   NA 138 03/22/2020   K 4.3 03/22/2020   CO2 30 03/22/2020   GLUCOSE 90 03/22/2020   BUN 22 03/22/2020   CREATININE 0.89  03/22/2020   BILITOT 0.4 03/22/2020   ALKPHOS 55 03/22/2020   AST 23 03/22/2020   ALT 24 03/22/2020   PROT 6.7 03/22/2020   ALBUMIN 4.3 03/22/2020   CALCIUM  9.2 03/22/2020   GFR 88.39 03/22/2020   Lab Results  Component Value Date   CHOL 172 03/22/2020   Lab Results  Component Value Date   HDL 52.60 03/22/2020   Lab Results  Component Value Date   LDLCALC 106 (H) 03/22/2020   Lab Results  Component Value Date   TRIG 66.0 03/22/2020   Lab Results  Component Value Date   CHOLHDL 3 03/22/2020   No results found for: HGBA1C    Assessment & Plan:   Problem List Items Addressed This Visit      Endocrine   Androgen deficiency - Primary   Relevant Medications   testosterone cypionate (DEPOTESTOSTERONE CYPIONATE) 200 MG/ML injection   Other Relevant Orders   Testosterone (Completed)     Musculoskeletal and Integument   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 predniSONE (DELTASONE) 20 MG tablet    Sig: Take 1 tablet (20 mg total) by mouth 2 (two) times daily with a meal for 7 days.    Dispense:  14 tablet    Refill:  0  . testosterone cypionate (DEPOTESTOSTERONE CYPIONATE) 200 MG/ML injection    Sig: Inject 1 mL (200 mg total) into the muscle every 14 (fourteen) days.    Dispense:  10 mL    Refill:  3    Follow-up: Return in about 6 months (around 02/06/2021).  Testosterone level drawn today.  He needs refills of his testosterone.  Discussed avoiding sun exposure to reduce his risk of future cancers.  He was given information on sun sensitivity.  [name], MD

## 2020-08-09 NOTE — Patient Instructions (Addendum)
Sun Sensitivity Sun sensitivity, also called photosensitivity, is a condition in which exposure to sunlight causes skin irritation. There are many types of sun sensitivity. Some medicines, skin products, and medical conditions can cause sun sensitivity that can range from mild to severe. If medicines or products are causing sun sensitivity, the condition may go away after you stop taking the medicine or using the product. In some cases, sun sensitivity is a long-lasting (chronic) condition. What are the causes? Common causes of sun sensitivity include:  Certain medicines.  Certain makeup products (cosmetics), lotions, or medicines that are applied to the skin.  Some health conditions, including: ? Connective tissue diseases, such as lupus. ? Redness and inflammation of the face or eyes (rosacea). ? White patches of skin (vitiligo). In some cases, sun sensitivity may be passed along from parent to child (inherited), or the cause may not be known. What increases the risk? This condition is more likely to develop in people who:  Have family members who have sun sensitivity.  Are in sunlight for long periods of time. What are the signs or symptoms? Symptoms of sun sensitivity vary. Common symptoms include:  Red (sunburned) skin that may be bumpy or form blisters.  Hives.  Itchy skin (pruritus).  Pain where skin is exposed to the sun.  Dry, flaky skin.  Swelling of the skin. Often with this condition, sunburn or other symptoms develop after a short amount of sun exposure. How is this diagnosed? This condition is diagnosed based on your symptoms. It is important to talk with your health care provider about your symptoms, including:  The timing of your symptoms, such as whether you have symptoms immediately after being in the sun, or after a long period of being in the sun.  How long your symptoms last.  Where your symptoms occur on your face or body. Some types of sun  sensitivity can be diagnosed with:  A skin biopsy. This a test that involves taking a small sample of skin to be examined.  A blood test to check for certain medical conditions or genetic factors.  Light testing to see how you react to UV (ultraviolet) light, which is the type of light that comes from the sun. How is this treated? This condition is often treated by preventing known causes of sun sensitivity. This may include protecting your skin from the sun and avoiding certain skin products. Your health care provider may recommend that you take an antihistamine medicine or apply prescription cream to affected areas to help relieve symptoms. Follow these instructions at home:  Do not break any blisters that you have.  Do not scratch your skin while you have symptoms. Doing that can make symptoms worse.  Apply a cool cloth to affected areas. This may help to relieve symptoms. Do not apply ice to your affected skin. Icing can cause further damage.  Take over-the-counter and prescription medicines only as told by your health care provider. How is this prevented?   Avoid skin products that cause symptoms, such as cosmetics and lotions.  Avoid being in the sun while taking medicines that are known to cause sun sensitivity. Ask your health care provider if any medicines that you take may cause sun sensitivity.  Avoid the sun when it is strongest, usually between 10 a.m. and 4 p.m.  Cover your skin with pants, long sleeves, and a hat when you are exposed to sunlight.  Apply sunscreen to dry skin 15 or more minutes before you go outside,  even on cloudy days. ? Use a sunscreen with an SPF of 15 or higher. Consider using an SPF of 30 or higher if you will be exposed to the sun for long periods of time. ? Use a waterproof or water-resistant sunscreen that protects against all of the sun's rays (broad-spectrum). ? Apply a thick coat of sunscreen over all exposed skin. Typically, a palm-size  amount of sunscreen will cover your whole body. ? Do not use sunscreen on a baby who is younger than 59 months of age.  Reapply sunscreen: ? About every 2 hours, whether it is sunny or cloudy. Reapply more often if you are sweating a lot. ? After swimming or playing in the water. Contact a health care provider if:  Your symptoms do not improve with treatment.  Your rash does not go away.  Your pain or itching is not controlled with medicine.  Your skin becomes more painful and swollen.  You have a fever.  You have open blisters. Get help right away if:  You have bleeding underneath skin that was exposed to the sun.  You have pus or fluid coming from any blisters.  You suddenly develop a rash as well as: ? Swelling around your eyes or lips. ? Trouble swallowing. ? Trouble breathing. Summary  Sun sensitivity, also called photosensitivity, is a condition in which exposure to sunlight causes skin irritation.  Some medicines, skin products, and medical conditions can cause sun sensitivity.  Avoid being in the sun while taking medicines that are known to cause sun sensitivity.  Apply sunscreen to dry skin 15 or more minutes before you go outside, even on cloudy days. Reapply sunscreen as directed. This information is not intended to replace advice given to you by your health care provider. Make sure you discuss any questions you have with your health care provider. Document Revised: 11/07/2017 Document Reviewed: 05/12/2017 Elsevier Patient Education  [place].

## 2020-08-11 MED ORDER — TESTOSTERONE CYPIONATE 200 MG/ML IM SOLN: 200.0000 mg | INTRAMUSCULAR | 3 refills | Status: DC

## 2020-08-11 NOTE — Addendum Note (Signed)
Addended by: Jon Billings on: 08/11/2020 01:27 PM   Modules accepted: Orders

## 2020-08-16 ENCOUNTER — Ambulatory Visit (INDEPENDENT_AMBULATORY_CARE_PROVIDER_SITE_OTHER): Payer: 59 | Admitting: Family Medicine

## 2020-08-16 ENCOUNTER — Other Ambulatory Visit: Payer: Self-pay

## 2020-08-16 DIAGNOSIS — E291 Testicular hypofunction: Secondary | ICD-10-CM

## 2020-08-16 MED ORDER — TESTOSTERONE CYPIONATE 200 MG/ML IM SOLN
200.0000 mg | INTRAMUSCULAR | Status: DC
  Administered 2020-08-16: 200 mg via INTRAMUSCULAR

## 2020-08-16 NOTE — Progress Notes (Signed)
Testosterone injection given in LUQ Pt tolerated injection well

## 2020-08-16 NOTE — Progress Notes (Signed)
I reviewed and agree with the documentation and plan as outlined below.   

## 2020-08-30 ENCOUNTER — Ambulatory Visit: Payer: 59

## 2020-09-01 ENCOUNTER — Telehealth: Payer: Self-pay

## 2020-09-01 NOTE — Telephone Encounter (Signed)
Called patient because he was due for his 2 week testosterone injection. Pt states he rescheduled appointment out further because he works out of town and will not be back in town until 09/13/2020. Pt states he often makes his appointments after getting his injection for two weeks but if he is working out of town at the time of his appointment he reschedules for his return home date. I informed patient this would be noted.

## 2020-09-12 ENCOUNTER — Other Ambulatory Visit: Payer: Self-pay

## 2020-09-13 ENCOUNTER — Ambulatory Visit (INDEPENDENT_AMBULATORY_CARE_PROVIDER_SITE_OTHER): Payer: 59

## 2020-09-13 DIAGNOSIS — E291 Testicular hypofunction: Secondary | ICD-10-CM

## 2020-09-13 MED ORDER — TESTOSTERONE CYPIONATE 200 MG/ML IM SOLN
200.0000 mg | INTRAMUSCULAR | Status: DC
  Administered 2020-09-13: 200 mg via INTRAMUSCULAR

## 2020-09-13 NOTE — Progress Notes (Signed)
Pt came in office per Dr. Bebe Shaggy orders an received his testosterone injection in RUQ and tolerated injection well. Pt was instructed to stay in office 15 to 20 min. An pt scheduled his next appointment two weeks out.

## 2020-09-25 ENCOUNTER — Other Ambulatory Visit: Payer: Self-pay

## 2020-09-26 ENCOUNTER — Ambulatory Visit (INDEPENDENT_AMBULATORY_CARE_PROVIDER_SITE_OTHER): Payer: 59

## 2020-09-26 DIAGNOSIS — N5239 Other post-surgical erectile dysfunction: Secondary | ICD-10-CM | POA: Diagnosis not present

## 2020-09-26 MED ORDER — TESTOSTERONE CYPIONATE 200 MG/ML IM SOLN
200.0000 mg | INTRAMUSCULAR | Status: DC
  Administered 2020-09-26: 200 mg via INTRAMUSCULAR

## 2020-09-26 NOTE — Progress Notes (Addendum)
Per orders of Dr. Ethelene Hal  injection of Testosterone gieven by Verline Lema L Mattalynn Crandle in right gluteal.  Patient tolerated injection well. Patient will make appointment for 2 weeks.  agreed

## 2020-09-26 NOTE — Patient Instructions (Signed)
Health Maintenance Due  Topic Date Due   Hepatitis C Screening  Never done   COVID-19 Vaccine (1) Never done   HIV Screening  Never done   COLONOSCOPY  Never done   INFLUENZA VACCINE  Never done    Depression screen [facility] 2/9 03/07/2020 03/07/2020  Decreased Interest 0 0  Down, Depressed, Hopeless 0 0  PHQ - 2 Score 0 0  Altered sleeping 0 -  Tired, decreased energy 0 -  Change in appetite 0 -  Feeling bad or failure about yourself  0 -  Trouble concentrating 0 -  Moving slowly or fidgety/restless 0 -  Suicidal thoughts 0 -  PHQ-9 Score 0 -

## 2020-09-27 ENCOUNTER — Ambulatory Visit: Payer: 59

## 2020-10-11 ENCOUNTER — Other Ambulatory Visit: Payer: Self-pay

## 2020-10-11 ENCOUNTER — Ambulatory Visit: Payer: 59

## 2020-10-12 ENCOUNTER — Ambulatory Visit: Payer: 59

## 2020-11-07 ENCOUNTER — Other Ambulatory Visit: Payer: Self-pay

## 2020-11-09 ENCOUNTER — Ambulatory Visit (INDEPENDENT_AMBULATORY_CARE_PROVIDER_SITE_OTHER): Payer: 59

## 2020-11-09 ENCOUNTER — Other Ambulatory Visit: Payer: Self-pay

## 2020-11-09 DIAGNOSIS — E291 Testicular hypofunction: Secondary | ICD-10-CM | POA: Diagnosis not present

## 2020-11-09 MED ORDER — TESTOSTERONE CYPIONATE 200 MG/ML IM SOLN
200.0000 mg | INTRAMUSCULAR | Status: DC
  Administered 2020-11-09 – 2020-11-23 (×2): 200 mg via INTRAMUSCULAR

## 2020-11-09 NOTE — Progress Notes (Signed)
After obtaining consent, and per orders of Dr. Ethelene Hal, injection of Testosterone given by Reuel Derby, RMA in left gluteal. Patient instructed to remain in clinic for 20 minutes afterwards, and to report any adverse reaction to me immediately.   Pt tolerated injection well and appointment will be made for 2 weeks.

## 2020-11-11 NOTE — Progress Notes (Signed)
Medical screening examination/treatment/procedure(s) were performed by the CMA. As primary care provider I was immediately available for consulation/collaboration. I agree with above documentation. Davaughn Hillyard, AGNP-C 

## 2020-11-22 ENCOUNTER — Other Ambulatory Visit: Payer: Self-pay

## 2020-11-23 ENCOUNTER — Ambulatory Visit (INDEPENDENT_AMBULATORY_CARE_PROVIDER_SITE_OTHER): Payer: 59

## 2020-11-23 DIAGNOSIS — E291 Testicular hypofunction: Secondary | ICD-10-CM

## 2020-11-23 MED ORDER — TESTOSTERONE CYPIONATE 200 MG/ML IM SOLN: 200.0000 mg | INTRAMUSCULAR | Status: DC

## 2020-11-23 NOTE — Patient Instructions (Signed)
Health Maintenance Due  Topic Date Due  . Hepatitis C Screening  Never done  . COVID-19 Vaccine (1) Never done  . HIV Screening  Never done  . COLONOSCOPY  Never done  . INFLUENZA VACCINE  Never done    Depression screen [facility] 2/9 03/07/2020 03/07/2020  Decreased Interest 0 0  Down, Depressed, Hopeless 0 0  PHQ - 2 Score 0 0  Altered sleeping 0 -  Tired, decreased energy 0 -  Change in appetite 0 -  Feeling bad or failure about yourself  0 -  Trouble concentrating 0 -  Moving slowly or fidgety/restless 0 -  Suicidal thoughts 0 -  PHQ-9 Score 0 -

## 2020-11-23 NOTE — Progress Notes (Signed)
I reviewed and agree with the documentation and plan as outlined below.   

## 2020-11-23 NOTE — Progress Notes (Signed)
Per orders of Dr. Ethelene Hal injection of Testoseterone given by Frutoso Schatz Tetsuo Coppola in right Gluteus Medius Patient tolerated injection well. No signs or symptoms of a reaction were noted prior to patient leaving the nurse visit. Patient will make appointment for 2 weeks.

## 2020-12-02 ENCOUNTER — Emergency Department (HOSPITAL_BASED_OUTPATIENT_CLINIC_OR_DEPARTMENT_OTHER)
Admission: EM | Admit: 2020-12-02 | Discharge: 2020-12-02 | Disposition: A | Discharge status: Home / Self Care | Payer: 59

## 2020-12-02 ENCOUNTER — Other Ambulatory Visit: Payer: Self-pay

## 2020-12-04 ENCOUNTER — Telehealth: Payer: Self-pay

## 2020-12-04 NOTE — Telephone Encounter (Signed)
Please advise message below  °

## 2020-12-04 NOTE — Telephone Encounter (Signed)
Patient calling to inform office that he just tested positive for Covid and would like to know if he can receive the antibody infusion as soon as possible.  Pt would like a call back.  Please advise.  CB# [phone]

## 2020-12-05 ENCOUNTER — Other Ambulatory Visit: Payer: Self-pay

## 2020-12-05 ENCOUNTER — Ambulatory Visit (HOSPITAL_COMMUNITY)
Admission: RE | Admit: 2020-12-05 | Discharge: 2020-12-05 | Disposition: A | Discharge status: Home / Self Care | Payer: 59 | Source: Ambulatory Visit | Attending: Pulmonary Disease | Admitting: Pulmonary Disease

## 2020-12-05 ENCOUNTER — Other Ambulatory Visit: Payer: Self-pay | Admitting: Nurse Practitioner

## 2020-12-05 DIAGNOSIS — U071 COVID-19: Secondary | ICD-10-CM | POA: Insufficient documentation

## 2020-12-05 MED ORDER — DIPHENHYDRAMINE HCL 50 MG/ML IJ SOLN: 50.0000 mg | Freq: Once | INTRAMUSCULAR | Status: DC | PRN

## 2020-12-05 MED ORDER — SODIUM CHLORIDE 0.9 % IV SOLN: Freq: Once | INTRAVENOUS | Status: AC

## 2020-12-05 MED ORDER — FAMOTIDINE IN NACL 20-0.9 MG/50ML-% IV SOLN: 20.0000 mg | Freq: Once | INTRAVENOUS | Status: DC | PRN

## 2020-12-05 MED ORDER — SODIUM CHLORIDE 0.9 % IV SOLN: INTRAVENOUS | Status: DC | PRN

## 2020-12-05 MED ORDER — EPINEPHRINE 0.3 MG/0.3ML IJ SOAJ: 0.3000 mg | Freq: Once | INTRAMUSCULAR | Status: DC | PRN

## 2020-12-05 MED ORDER — METHYLPREDNISOLONE SODIUM SUCC 125 MG IJ SOLR: 125.0000 mg | Freq: Once | INTRAMUSCULAR | Status: DC | PRN

## 2020-12-05 MED ORDER — ALBUTEROL SULFATE HFA 108 (90 BASE) MCG/ACT IN AERS: 2.0000 | INHALATION_SPRAY | Freq: Once | RESPIRATORY_TRACT | Status: DC | PRN

## 2020-12-05 NOTE — Progress Notes (Signed)
I connected by phone with Tyler Ferguson on 12/05/2020 at 12:13 PM to discuss the potential use of a treatment for mild to moderate COVID-19 viral infection in non-hospitalized patients.  This patient is a 54 y.o. male that meets the FDA criteria for Emergency Use Authorization of bamlanivimab/etesevimab, casirivimab\imdevimab, or sotrovimab  Has a (+) direct SARS-CoV-2 viral test result  Has mild or moderate COVID-19   Is ? 56 years of age and weighs ? 40 kg  Is NOT hospitalized due to COVID-19  Is NOT requiring oxygen therapy or requiring an increase in baseline oxygen flow rate due to COVID-19  Is within 10 days of symptom onset  Has at least one of the high risk factor(s) for progression to severe COVID-19 and/or hospitalization as defined in EUA.  Specific high risk criteria : BMI > 25   Onset 12/22. Unvaccinated.    I have spoken and communicated the following to the patient or parent/caregiver:  1. FDA has authorized the emergency use of bamlanivimab/etesevimab, casirivimab\imdevimab, or sotrovimab for the treatment of mild to moderate COVID-19 in adults and pediatric patients with positive results of direct SARS-CoV-2 viral testing who are 67 years of age and older weighing at least 40 kg, and who are at high risk for progressing to severe COVID-19 and/or hospitalization.  2. The significant known and potential risks and benefits of bamlanivimab/etesevimab, casirivimab\imdevimab, or sotrovimab, and the extent to which such potential risks and benefits are unknown.  3. Information on available alternative treatments and the risks and benefits of those alternatives, including clinical trials.  4. Patients treated with bamlanivimab/etesevimab, casirivimab\imdevimab, or sotrovimab should continue to self-isolate and use infection control measures (e.g., wear mask, isolate, social distance, avoid sharing personal items, clean and disinfect "high touch" surfaces, and frequent  handwashing) according to CDC guidelines.   5. The patient or parent/caregiver has the option to accept or refuse bamlanivimab/etesevimab, casirivimab\imdevimab, or sotrovimab.  After reviewing this information with the patient, the patient has agreed to receive one of the available covid 19 monoclonal antibodies and will be provided an appropriate fact sheet prior to infusion.[name], DNP, AGNP-C [phone] (Infusion Center Hotline)

## 2020-12-05 NOTE — Progress Notes (Signed)
  Diagnosis: COVID-19  Physician:  Dr. Patrick Wright  Procedure: Covid Infusion Clinic Med: casirivimab\imdevimab infusion - Provided patient with casirivimab\imdevimab fact sheet for patients, parents and caregivers prior to infusion.  Complications: No immediate complications noted.  Discharge: Discharged home   [name] 12/05/2020   

## 2020-12-05 NOTE — Progress Notes (Addendum)
Patient reviewed Fact Sheet for Patients, Parents, and Caregivers for Emergency Use Authorization (EUA) of Regen-Cov for the Treatment of Coronavirus.  Patient also reviewed and is agreeable to the estimated cost of treatment.  Pt indicates understanding that Regen-Cov has not been found to be effective against Omicron variant.  Patient is agreeable to proceed.

## 2020-12-05 NOTE — Discharge Instructions (Signed)
10 Things You Can Do to Manage Your COVID-19 Symptoms at Home If you have possible or confirmed COVID-19: 1. Stay home from work and school. And stay away from other public places. If you must go out, avoid using any kind of public transportation, ridesharing, or taxis. 2. Monitor your symptoms carefully. If your symptoms get worse, call your healthcare provider immediately. 3. Get rest and stay hydrated. 4. If you have a medical appointment, call the healthcare provider ahead of time and tell them that you have or may have COVID-19. 5. For medical emergencies, call 911 and notify the dispatch personnel that you have or may have COVID-19. 6. Cover your cough and sneezes with a tissue or use the inside of your elbow. 7. Wash your hands often with soap and water for at least 20 seconds or clean your hands with an alcohol-based hand sanitizer that contains at least 60% alcohol. 8. As much as possible, stay in a specific room and away from other people in your home. Also, you should use a separate bathroom, if available. If you need to be around other people in or outside of the home, wear a mask. 9. Avoid sharing personal items with other people in your household, like dishes, towels, and bedding. 10. Clean all surfaces that are touched often, like counters, tabletops, and doorknobs. Use household cleaning sprays or wipes according to the label instructions. cdc.gov/coronavirus 06/09/2019 This information is not intended to replace advice given to you by your health care provider. Make sure you discuss any questions you have with your health care provider. Document Revised: 11/11/2019 Document Reviewed: 11/11/2019 Elsevier Patient Education  2020 Elsevier Inc. What types of side effects do monoclonal antibody drugs cause?  Common side effects  In general, the more common side effects caused by monoclonal antibody drugs include: . Allergic reactions, such as hives or itching . Flu-like signs and  symptoms, including chills, fatigue, fever, and muscle aches and pains . Nausea, vomiting . Diarrhea . Skin rashes . Low blood pressure   The CDC is recommending patients who receive monoclonal antibody treatments wait at least 90 days before being vaccinated.  Currently, there are no data on the safety and efficacy of mRNA COVID-19 vaccines in persons who received monoclonal antibodies or convalescent plasma as part of COVID-19 treatment. Based on the estimated half-life of such therapies as well as evidence suggesting that reinfection is uncommon in the 90 days after initial infection, vaccination should be deferred for at least 90 days, as a precautionary measure until additional information becomes available, to avoid interference of the antibody treatment with vaccine-induced immune responses. If you have any questions or concerns after the infusion please call the Advanced Practice Provider on call at [phone]. This number is ONLY intended for your use regarding questions or concerns about the infusion post-treatment side-effects.  Please do not provide this number to others for use. For return to work notes please contact your primary care provider.   If someone you know is interested in receiving treatment please have them call the COVID hotline at [phone].   

## 2020-12-11 ENCOUNTER — Telehealth: Payer: Self-pay

## 2020-12-11 NOTE — Telephone Encounter (Signed)
Please advise message below  °

## 2020-12-11 NOTE — Telephone Encounter (Signed)
Patient calling to ask if he can get tested to see if he has low antibodies for the Covid virus.  He recently tested positive.  Please advise. CB# [phone]

## 2020-12-12 NOTE — Telephone Encounter (Signed)
Patient aware of message below and will abide by recommendations. No concerns.

## 2020-12-18 ENCOUNTER — Other Ambulatory Visit: Payer: Self-pay

## 2020-12-19 ENCOUNTER — Ambulatory Visit (INDEPENDENT_AMBULATORY_CARE_PROVIDER_SITE_OTHER): Payer: 59

## 2020-12-19 DIAGNOSIS — E291 Testicular hypofunction: Secondary | ICD-10-CM

## 2020-12-19 MED ORDER — TESTOSTERONE CYPIONATE 200 MG/ML IM SOLN
200.0000 mg | INTRAMUSCULAR | Status: DC
  Administered 2020-12-19: 200 mg via INTRAMUSCULAR

## 2020-12-19 NOTE — Patient Instructions (Signed)
Health Maintenance Due  Topic Date Due  . Hepatitis C Screening  Never done  . COVID-19 Vaccine (1) Never done  . HIV Screening  Never done  . COLONOSCOPY (Pts 45-67yrs Insurance coverage will need to be confirmed)  Never done  . INFLUENZA VACCINE  Never done    Depression screen [facility] 2/9 03/07/2020 03/07/2020  Decreased Interest 0 0  Down, Depressed, Hopeless 0 0  PHQ - 2 Score 0 0  Altered sleeping 0 -  Tired, decreased energy 0 -  Change in appetite 0 -  Feeling bad or failure about yourself  0 -  Trouble concentrating 0 -  Moving slowly or fidgety/restless 0 -  Suicidal thoughts 0 -  PHQ-9 Score 0 -

## 2020-12-19 NOTE — Progress Notes (Signed)
Per orders of Dr. Ethelene Hal injection of TESTOSTERONE CYPIONATE 200mg  given by Verline Lema L Tyus in [place] Patient tolerated injection well. No signs or symptoms of a reaction were noted prior to patient leaving the nurse visit. Patient will be out of town for his 2 week injection and will receive it at an UC at that time.  Patient will return to office in 1 month for injection at this location.

## 2021-01-29 ENCOUNTER — Other Ambulatory Visit: Payer: Self-pay

## 2021-01-30 ENCOUNTER — Ambulatory Visit (INDEPENDENT_AMBULATORY_CARE_PROVIDER_SITE_OTHER): Payer: 59

## 2021-01-30 DIAGNOSIS — E291 Testicular hypofunction: Secondary | ICD-10-CM | POA: Diagnosis not present

## 2021-01-30 MED ORDER — TESTOSTERONE CYPIONATE 200 MG/ML IM SOLN
200.0000 mg | INTRAMUSCULAR | Status: DC
Start: 2021-01-30 — End: 2021-06-05
  Administered 2021-01-30 – 2021-06-05 (×4): 200 mg via INTRAMUSCULAR

## 2021-01-30 NOTE — Progress Notes (Signed)
Per orders of Dr. Ethelene Hal pt is here for 57mL testosterone injection via IM located in the glute. Pt tolerated injection well pt to return in around 2 weeks for next injection.

## 2021-02-02 NOTE — Progress Notes (Signed)
Medical screening examination/treatment/procedure(s) were performed by the CMA. As primary care provider I was immediately available for consulation/collaboration. I agree with above documentation. Janes Colegrove, AGNP-C 

## 2021-02-12 ENCOUNTER — Other Ambulatory Visit: Payer: Self-pay

## 2021-02-13 ENCOUNTER — Ambulatory Visit (INDEPENDENT_AMBULATORY_CARE_PROVIDER_SITE_OTHER): Payer: 59

## 2021-02-13 DIAGNOSIS — E291 Testicular hypofunction: Secondary | ICD-10-CM | POA: Diagnosis not present

## 2021-02-13 MED ORDER — TESTOSTERONE CYPIONATE 200 MG/ML IM SOLN: 200.0000 mg | INTRAMUSCULAR | Status: DC

## 2021-02-13 NOTE — Progress Notes (Signed)
Per orders of Dr. Ethelene Hal pt is here for Testosterone injection, pt received injection in right glute ( upper outter quadrant) given by [place] CMA/CPT. Pt tolerated injection well.

## 2021-02-26 ENCOUNTER — Other Ambulatory Visit: Payer: Self-pay

## 2021-02-28 ENCOUNTER — Ambulatory Visit: Payer: 59

## 2021-03-01 ENCOUNTER — Other Ambulatory Visit: Payer: Self-pay

## 2021-03-01 ENCOUNTER — Ambulatory Visit (INDEPENDENT_AMBULATORY_CARE_PROVIDER_SITE_OTHER): Payer: 59

## 2021-03-01 DIAGNOSIS — E291 Testicular hypofunction: Secondary | ICD-10-CM | POA: Diagnosis not present

## 2021-03-01 NOTE — Progress Notes (Signed)
After obtaining consent, and per orders of Dr. Ethelene Hal, injection of testosterone given in left glutt by Lynda Rainwater. Patient instructed to remain in clinic for 20 minutes afterwards, and to report any adverse reaction to me immediately.

## 2021-03-20 ENCOUNTER — Other Ambulatory Visit: Payer: Self-pay

## 2021-03-20 ENCOUNTER — Ambulatory Visit (INDEPENDENT_AMBULATORY_CARE_PROVIDER_SITE_OTHER): Payer: 59

## 2021-03-20 DIAGNOSIS — E291 Testicular hypofunction: Secondary | ICD-10-CM | POA: Diagnosis not present

## 2021-03-20 MED ORDER — TESTOSTERONE CYPIONATE 200 MG/ML IM SOLN
200.0000 mg | INTRAMUSCULAR | Status: DC
  Administered 2021-03-20 – 2021-05-16 (×3): 200 mg via INTRAMUSCULAR

## 2021-03-20 NOTE — Progress Notes (Addendum)
After obtaining consent, and per orders of Dr. Ethelene Hal, injection of testosterone (200mg ) given by Arcelia Jew. Patient instructed to remain in clinic for 20 minutes afterwards, and to report any adverse reaction to me immediately.  agreed

## 2021-03-29 NOTE — Patient Instructions (Signed)
Health Maintenance Due  Topic Date Due  . Hepatitis C Screening  Never done  . COVID-19 Vaccine (1) Never done  . HIV Screening  Never done  . COLONOSCOPY (Pts 45-15yrs Insurance coverage will need to be confirmed)  Never done    Depression screen [facility] 2/9 03/07/2020 03/07/2020  Decreased Interest 0 0  Down, Depressed, Hopeless 0 0  PHQ - 2 Score 0 0  Altered sleeping 0 -  Tired, decreased energy 0 -  Change in appetite 0 -  Feeling bad or failure about yourself  0 -  Trouble concentrating 0 -  Moving slowly or fidgety/restless 0 -  Suicidal thoughts 0 -  PHQ-9 Score 0 -

## 2021-04-17 ENCOUNTER — Ambulatory Visit (INDEPENDENT_AMBULATORY_CARE_PROVIDER_SITE_OTHER): Payer: 59

## 2021-04-17 ENCOUNTER — Other Ambulatory Visit: Payer: Self-pay

## 2021-04-17 DIAGNOSIS — E291 Testicular hypofunction: Secondary | ICD-10-CM

## 2021-04-17 NOTE — Progress Notes (Addendum)
Per orders of Dr Gena Fray, injection of Testosterone given in [place] by [facility], cma.  Patient tolerated injection well.  He will be out of town when next one is due and will go to an urgent care there in 2 weeks. Dm/cma  agree

## 2021-05-16 ENCOUNTER — Other Ambulatory Visit: Payer: Self-pay

## 2021-05-16 ENCOUNTER — Ambulatory Visit (INDEPENDENT_AMBULATORY_CARE_PROVIDER_SITE_OTHER): Payer: 59

## 2021-05-16 DIAGNOSIS — E291 Testicular hypofunction: Secondary | ICD-10-CM | POA: Diagnosis not present

## 2021-05-16 NOTE — Progress Notes (Signed)
Per orders of Dr. Ethelene Hal pt is here for next testosterone injection, pt received injection in right upped outer quadrant, given by [place]?CPT. pt tolerated injection well. Pt was notifed that it is time for a 6 month follow up and was instructed to schedule follow up visit.

## 2021-05-30 ENCOUNTER — Ambulatory Visit: Payer: 59

## 2021-06-04 ENCOUNTER — Other Ambulatory Visit: Payer: Self-pay | Admitting: Family Medicine

## 2021-06-04 DIAGNOSIS — N5239 Other post-surgical erectile dysfunction: Secondary | ICD-10-CM

## 2021-06-04 MED ORDER — SILDENAFIL CITRATE 100 MG PO TABS: 50.0000 mg | ORAL_TABLET | Freq: Every day | ORAL | 5 refills | Status: DC | PRN

## 2021-06-04 NOTE — Telephone Encounter (Signed)
What is the name of the medication? sildenafil (VIAGRA) 100 MG tablet [[id number]]   Have you contacted your pharmacy to request a refill? He is needing a refill.   Which pharmacy would you like this sent to? Pharmacy  [facility] [address] [place], [place] - [address]  [address] [place] [address]  Phone:  [phone]  Fax:  [phone]      Patient notified that their request is being sent to the clinical staff for review and that they should receive a call once it is complete. If they do not receive a call within 72 hours they can check with their pharmacy or our office.

## 2021-06-04 NOTE — Telephone Encounter (Signed)
Refill request for pending Rx last OV 08/20/20 last refill July 2021. Please advise.

## 2021-06-04 NOTE — Telephone Encounter (Signed)
Refill sent in

## 2021-06-05 ENCOUNTER — Other Ambulatory Visit: Payer: Self-pay

## 2021-06-05 ENCOUNTER — Ambulatory Visit (INDEPENDENT_AMBULATORY_CARE_PROVIDER_SITE_OTHER): Payer: 59

## 2021-06-05 DIAGNOSIS — E291 Testicular hypofunction: Secondary | ICD-10-CM

## 2021-06-05 MED ORDER — TESTOSTERONE CYPIONATE 200 MG/ML IM SOLN
200.0000 mg | INTRAMUSCULAR | Status: DC
  Administered 2021-06-19: 200 mg via INTRAMUSCULAR

## 2021-06-05 NOTE — Progress Notes (Addendum)
Per orders of Dr. Ethelene Hal Pt is here for testosterone injection, pt received injection in left upper outer quadrant, given by Marchia Bond, CMA. Pt tolerated injection well. Pt was notified that he has a f/u appt with provider next week on 06/12/2021 and was instructed to notify his provider that he needs refills for testosterone.

## 2021-06-06 ENCOUNTER — Ambulatory Visit: Payer: 59 | Admitting: Family Medicine

## 2021-06-12 ENCOUNTER — Encounter: Payer: Self-pay | Admitting: Family Medicine

## 2021-06-12 ENCOUNTER — Telehealth: Payer: Self-pay | Admitting: Family Medicine

## 2021-06-12 ENCOUNTER — Ambulatory Visit (INDEPENDENT_AMBULATORY_CARE_PROVIDER_SITE_OTHER): Payer: 59 | Admitting: Family Medicine

## 2021-06-12 ENCOUNTER — Ambulatory Visit: Payer: 59 | Admitting: Family Medicine

## 2021-06-12 ENCOUNTER — Other Ambulatory Visit: Payer: Self-pay

## 2021-06-12 VITALS — BP 138/76 | HR 62 | Temp 97.7°F | Ht 70.0 in | Wt 216.4 lb

## 2021-06-12 DIAGNOSIS — R150 Incomplete defecation: Secondary | ICD-10-CM

## 2021-06-12 DIAGNOSIS — S3992XS Unspecified injury of lower back, sequela: Secondary | ICD-10-CM

## 2021-06-12 DIAGNOSIS — S3992XA Unspecified injury of lower back, initial encounter: Secondary | ICD-10-CM | POA: Insufficient documentation

## 2021-06-12 DIAGNOSIS — E291 Testicular hypofunction: Secondary | ICD-10-CM

## 2021-06-12 DIAGNOSIS — K623 Rectal prolapse: Secondary | ICD-10-CM

## 2021-06-12 DIAGNOSIS — Z0000 Encounter for general adult medical examination without abnormal findings: Secondary | ICD-10-CM | POA: Diagnosis not present

## 2021-06-12 DIAGNOSIS — F4321 Adjustment disorder with depressed mood: Secondary | ICD-10-CM | POA: Insufficient documentation

## 2021-06-12 NOTE — Telephone Encounter (Signed)
Pt is wanting to know which covid vaccine would be best for him to get. Please advise pt at [phone].

## 2021-06-12 NOTE — Progress Notes (Signed)
Established Patient Office Visit  Subjective:  Patient ID: Tyler Ferguson, male    DOB: Jan 18, 1966  Age: 55 y.o. MRN: [id number]  CC:  Chief Complaint  Patient presents with   Follow-up    Follow up on testosterone and labs. No concerns.     HPI Tyler Ferguson presents for a physical exam and follow-up of androgen deficiency.  Has been taking his testosterone biweekly.  Last dose was 1 week ago today.  Obviously cannot tell much of a difference.  He is working out regularly in [employer] and does not see much of a difference there either.  He had COVID back at the end of the year.  It was not a particularly difficult infection for him.  Of special significance his wife passed from [place] when she contracted it at about the same time.  She had a history of COPD.  She was placed on a ventilator and passed shortly after that time.  He has not had a colonoscopy but would like to go for one at this point.  He is nonfasting today.  He does experience some nocturia but does not fluid restrict prior to bedtime.  He has had no change in his bowel habits.  He has had issues with stooling after a back injury 7 some years ago.  History reviewed. No pertinent past medical history.  Past Surgical History:  Procedure Laterality Date   BACK SURGERY  2007    Family History  Problem Relation Age of Onset   Heart disease Mother    Cancer Father    Cancer Sister     Social History   Socioeconomic History   Marital status: Married    Spouse name: Not on file   Number of children: Not on file   Years of education: Not on file   Highest education level: Not on file  Occupational History   Not on file  Tobacco Use   Smoking status: Former    Pack years: 0.00   Smokeless tobacco: Never  Vaping Use   Vaping Use: Never used  Substance and Sexual Activity   Alcohol use: Yes    Alcohol/week: 1.0 standard drink    Types: 1 Cans of beer per week    Comment: social   Drug use: Never   Sexual  activity: Not on file  Other Topics Concern   Not on file  Social History Narrative   Not on file   Social Determinants of Health   Financial Resource Strain: Not on file  Food Insecurity: Not on file  Transportation Needs: Not on file  Physical Activity: Not on file  Stress: Not on file  Social Connections: Not on file  Intimate Partner Violence: Not on file    Outpatient Medications Prior to Visit  Medication Sig Dispense Refill   Amino Acids (AMINO ACID PO) Take by mouth.     CREATINE MONOHYDRATE PO Take by mouth.     ibuprofen (ADVIL) 100 MG/5ML suspension Take 200 mg by mouth every 4 (four) hours as needed.     Multiple Vitamin (MULTIVITAMIN) capsule Take 1 capsule by mouth daily.     Omega-3 1000 MG CAPS Take 1 mg by mouth daily.     penicillin v potassium (VEETID) 250 MG tablet Take 250 mg by mouth 4 (four) times daily.     sildenafil (VIAGRA) 100 MG tablet Take 0.5-1 tablets (50-100 mg total) by mouth daily as needed for erectile dysfunction. 20 tablet 5   testosterone  cypionate (DEPOTESTOSTERONE CYPIONATE) 200 MG/ML injection Inject 1 mL (200 mg total) into the muscle every 14 (fourteen) days. 10 mL 3   Facility-Administered Medications Prior to Visit  Medication Dose Route Frequency Provider Last Rate Last Admin   testosterone cypionate (DEPOTESTOSTERONE CYPIONATE) injection 200 mg  200 mg Intramuscular Q14 Days Cirigliano, Mary K, DO   200 mg at 08/16/20 1520   testosterone cypionate (DEPOTESTOSTERONE CYPIONATE) injection 200 mg  200 mg Intramuscular Q14 Days [name], MD   200 mg at 09/13/20 0831   testosterone cypionate (DEPOTESTOSTERONE CYPIONATE) injection 200 mg  200 mg Intramuscular Q14 Days [name], MD        No Known Allergies  ROS Review of Systems  Constitutional:  Negative for diaphoresis, fatigue, fever and unexpected weight change.  HENT: Negative.    Eyes:  Negative for photophobia and visual disturbance.  Respiratory:  Negative.    Cardiovascular: Negative.   Gastrointestinal:  Negative for abdominal pain, anal bleeding and blood in stool.  Endocrine: Negative for polyphagia and polyuria.  Genitourinary:  Negative for difficulty urinating, frequency and urgency.  Neurological:  Negative for speech difficulty and weakness.  Psychiatric/Behavioral: Negative.       Objective:    Physical Exam Vitals and nursing note reviewed.  Constitutional:      General: He is not in acute distress.    Appearance: Normal appearance. He is normal weight. He is not ill-appearing, toxic-appearing or diaphoretic.  HENT:     Head: Normocephalic and atraumatic.     Right Ear: Tympanic membrane, ear canal and external ear normal.     Left Ear: Tympanic membrane, ear canal and external ear normal.     Mouth/Throat:     Mouth: Mucous membranes are moist.     Pharynx: Oropharynx is clear. No oropharyngeal exudate or posterior oropharyngeal erythema.  Eyes:     General: No scleral icterus.       Right eye: No discharge.        Left eye: No discharge.     Extraocular Movements: Extraocular movements intact.     Conjunctiva/sclera: Conjunctivae normal.     Pupils: Pupils are equal, round, and reactive to light.  Neck:     Vascular: No carotid bruit.  Cardiovascular:     Rate and Rhythm: Normal rate and regular rhythm.  Pulmonary:     Effort: Pulmonary effort is normal.     Breath sounds: Normal breath sounds.  Abdominal:     General: Abdomen is flat. Bowel sounds are normal. There is no distension.     Palpations: Abdomen is soft. There is no mass.     Tenderness: There is no abdominal tenderness. There is no guarding or rebound.     Hernia: No hernia is present.  Genitourinary:    Prostate: Not enlarged, not tender and no nodules present.     Rectum: Guaiac result negative. No tenderness. Abnormal anal tone.    Musculoskeletal:     Cervical back: Normal range of motion. No rigidity or tenderness.   Lymphadenopathy:     Cervical: No cervical adenopathy.  Skin:    General: Skin is warm and dry.  Neurological:     Mental Status: He is alert and oriented to person, place, and time.    BP 138/76   Pulse 62   Temp 97.7 F (36.5 C) (Temporal)   Ht 5\' 10"  (1.778 m)   Wt 216 lb 6.4 oz (98.2 kg)   SpO2 98%  BMI 31.05 kg/m  Wt Readings from Last 3 Encounters:  06/12/21 216 lb 6.4 oz (98.2 kg)  08/09/20 222 lb 3.2 oz (100.8 kg)  06/29/20 218 lb (98.9 kg)     Health Maintenance Due  Topic Date Due   HIV Screening  Never done   Hepatitis C Screening  Never done   COLONOSCOPY (Pts 45-10yrs Insurance coverage will need to be confirmed)  Never done   Zoster Vaccines- Shingrix (1 of 2) Never done    There are no preventive care reminders to display for this patient.  No results found for: TSH Lab Results  Component Value Date   WBC 3.8 (L) 06/29/2020   HGB 13.0 06/29/2020   HCT 38.6 (L) 06/29/2020   MCV 93.7 06/29/2020   PLT 238.0 06/29/2020   Lab Results  Component Value Date   NA 138 03/22/2020   K 4.3 03/22/2020   CO2 30 03/22/2020   GLUCOSE 90 03/22/2020   BUN 22 03/22/2020   CREATININE 0.89 03/22/2020   BILITOT 0.4 03/22/2020   ALKPHOS 55 03/22/2020   AST 23 03/22/2020   ALT 24 03/22/2020   PROT 6.7 03/22/2020   ALBUMIN 4.3 03/22/2020   CALCIUM 9.2 03/22/2020   GFR 88.39 03/22/2020   Lab Results  Component Value Date   CHOL 172 03/22/2020   Lab Results  Component Value Date   HDL 52.60 03/22/2020   Lab Results  Component Value Date   LDLCALC 106 (H) 03/22/2020   Lab Results  Component Value Date   TRIG 66.0 03/22/2020   Lab Results  Component Value Date   CHOLHDL 3 03/22/2020   No results found for: HGBA1C    Assessment & Plan:   Problem List Items Addressed This Visit       Digestive   Partial rectal prolapse   Relevant Orders   Ambulatory referral to Gastroenterology     Endocrine   Androgen deficiency   Relevant Orders    Testosterone   Testosterone, free   Testosterone, % free   Sex hormone binding globulin     Other   Incomplete defecation   Relevant Orders   Ambulatory referral to Gastroenterology   Healthcare maintenance - Primary   Relevant Orders   CBC   Comprehensive metabolic panel   PSA   Urinalysis, Routine w reflex microscopic   LDL cholesterol, direct   Ambulatory referral to Gastroenterology   Lower back injury   Grieving    No orders of the defined types were placed in this encounter.   Follow-up: Return in about 6 months (around 12/13/2021).   Continue testosterone status post lab results and normal hemoglobin.  Advised that pharmaceutical testosterone at clinical dosage would be unlikely to build muscle mass.  Advised COVID-vaccine. [name], MD

## 2021-06-13 ENCOUNTER — Other Ambulatory Visit: Payer: 59

## 2021-06-13 LAB — CBC
HCT: 42.6 % (ref 39.0–52.0)
Hemoglobin: 14.5 g/dL (ref 13.0–17.0)
MCHC: 33.9 g/dL (ref 30.0–36.0)
MCV: 94 fl (ref 78.0–100.0)
Platelets: 265 10*3/uL (ref 150.0–400.0)
RBC: 4.53 Mil/uL (ref 4.22–5.81)
RDW: 13.4 % (ref 11.5–15.5)
WBC: 5.6 10*3/uL (ref 4.0–10.5)

## 2021-06-13 LAB — COMPREHENSIVE METABOLIC PANEL
ALT: 24 U/L (ref 0–53)
AST: 22 U/L (ref 0–37)
Albumin: 4 g/dL (ref 3.5–5.2)
Alkaline Phosphatase: 48 U/L (ref 39–117)
BUN: 16 mg/dL (ref 6–23)
CO2: 29 mEq/L (ref 19–32)
Calcium: 9.3 mg/dL (ref 8.4–10.5)
Chloride: 102 mEq/L (ref 96–112)
Creatinine, Ser: 0.68 mg/dL (ref 0.40–1.50)
GFR: 103.32 mL/min (ref >60.00)
Glucose, Bld: 97 mg/dL (ref 70–99)
Potassium: 4.3 mEq/L (ref 3.5–5.1)
Sodium: 138 mEq/L (ref 135–145)
Total Bilirubin: 0.4 mg/dL (ref 0.2–1.2)
Total Protein: 6.5 g/dL (ref 6.0–8.3)

## 2021-06-13 LAB — URINALYSIS, ROUTINE W REFLEX MICROSCOPIC
Bilirubin Urine: NEGATIVE
Hgb urine dipstick: NEGATIVE
Ketones, ur: NEGATIVE
Leukocytes,Ua: NEGATIVE
Nitrite: NEGATIVE
RBC / HPF: NONE SEEN (ref 0–?)
Specific Gravity, Urine: 1.01 (ref 1.000–1.030)
Total Protein, Urine: NEGATIVE
Urine Glucose: NEGATIVE
Urobilinogen, UA: 0.2 (ref 0.0–1.0)
WBC, UA: NONE SEEN (ref 0–?)
pH: 6 (ref 5.0–8.0)

## 2021-06-13 LAB — PSA: PSA: 0.86 ng/mL (ref 0.10–4.00)

## 2021-06-13 LAB — LDL CHOLESTEROL, DIRECT: Direct LDL: 97 mg/dL

## 2021-06-13 LAB — TESTOSTERONE: Testosterone: 507.47 ng/dL (ref 300.00–890.00)

## 2021-06-13 NOTE — Telephone Encounter (Signed)
I called pt and left him a vm-that Dr. Ethelene Hal has suggested Phizer.

## 2021-06-13 NOTE — Addendum Note (Signed)
Addended by: Lynnea Ferrier on: 06/13/2021 07:57 AM   Modules accepted: Orders

## 2021-06-13 NOTE — Progress Notes (Signed)
Per the orders of Dr. Dagoberto Reef pt is here to provide urine sample. Amount provided adequate.

## 2021-06-15 ENCOUNTER — Other Ambulatory Visit: Payer: Self-pay | Admitting: Family Medicine

## 2021-06-15 DIAGNOSIS — E291 Testicular hypofunction: Secondary | ICD-10-CM

## 2021-06-15 MED ORDER — TESTOSTERONE CYPIONATE 200 MG/ML IM SOLN: 200.0000 mg | INTRAMUSCULAR | 3 refills | Status: DC

## 2021-06-15 NOTE — Telephone Encounter (Signed)
Patient calling for refill on pending Rx per patient he was not sure if you wanted to change anything with testosterone after labs reviewed. Please advise.

## 2021-06-15 NOTE — Telephone Encounter (Signed)
Pt requesting call back to go over labs, callback [phone]

## 2021-06-19 ENCOUNTER — Other Ambulatory Visit: Payer: Self-pay

## 2021-06-19 ENCOUNTER — Ambulatory Visit (INDEPENDENT_AMBULATORY_CARE_PROVIDER_SITE_OTHER): Payer: 59

## 2021-06-19 DIAGNOSIS — E291 Testicular hypofunction: Secondary | ICD-10-CM | POA: Diagnosis not present

## 2021-06-19 LAB — TESTOSTERONE, % FREE: Testosterone-% Free: 0.7 %

## 2021-06-19 LAB — SEX HORMONE BINDING GLOBULIN: Sex Hormone Binding: 50 nmol/L (ref 22–77)

## 2021-06-19 LAB — TESTOSTERONE, FREE: TESTOSTERONE FREE: 52.8 pg/mL (ref 46.0–224.0)

## 2021-06-19 MED ORDER — TESTOSTERONE CYPIONATE 200 MG/ML IM SOLN
200.0000 mg | INTRAMUSCULAR | Status: DC
Start: 2021-06-19 — End: 2022-03-25

## 2021-06-19 NOTE — Progress Notes (Signed)
Per orders of Dr. Ethelene Hal Pt is here for testosterone injection, pt received 200mg /39mL injection in right lateral superior gluteal area given by Levada Schilling' [place]. Pt tolerated injection well. No adverse reactions after Pt received injection.

## 2021-07-17 ENCOUNTER — Other Ambulatory Visit: Payer: Self-pay

## 2021-07-17 ENCOUNTER — Ambulatory Visit (INDEPENDENT_AMBULATORY_CARE_PROVIDER_SITE_OTHER): Payer: 59

## 2021-07-17 DIAGNOSIS — E291 Testicular hypofunction: Secondary | ICD-10-CM

## 2021-07-17 MED ORDER — TESTOSTERONE CYPIONATE 200 MG/ML IM SOLN
200.0000 mg | INTRAMUSCULAR | Status: DC
  Administered 2021-07-17: 200 mg via INTRAMUSCULAR

## 2021-07-17 NOTE — Patient Instructions (Signed)
Patient here for testosterone injection per order of Dr. Ethelene Hal.  Injection given by Reuel Derby, RMA. Pt tolerated injection well and appointment scheduled for 2 weeks.

## 2021-07-25 NOTE — Progress Notes (Signed)
Patient here for testosterone injection per order of Dr. Ethelene Hal.  Injection given by Reuel Derby, RMA. Pt tolerated injection well and appointment scheduled for 2 weeks.

## 2021-07-27 ENCOUNTER — Telehealth: Payer: Self-pay | Admitting: Family Medicine

## 2021-07-27 NOTE — Telephone Encounter (Signed)
Okay for cologuard instead of colonoscopy? Please advise

## 2021-07-27 NOTE — Telephone Encounter (Signed)
Pt was told it was OK for him to get a Cologuard verses a colonoscopy considering he doesn't have any family history of colon cancer. He would like to know what you think about this. Please advise pt at [phone].

## 2021-07-30 NOTE — Telephone Encounter (Signed)
Patient aware of message below and will stop by to sign for to have faxed off for cologuard kit to be mailed to the home.

## 2021-07-31 ENCOUNTER — Other Ambulatory Visit: Payer: Self-pay

## 2021-07-31 ENCOUNTER — Ambulatory Visit (INDEPENDENT_AMBULATORY_CARE_PROVIDER_SITE_OTHER): Payer: 59

## 2021-07-31 DIAGNOSIS — E291 Testicular hypofunction: Secondary | ICD-10-CM | POA: Diagnosis not present

## 2021-07-31 MED ORDER — TESTOSTERONE CYPIONATE 200 MG/ML IM SOLN
200.0000 mg | INTRAMUSCULAR | Status: DC
  Administered 2021-07-31 – 2021-09-11 (×3): 200 mg via INTRAMUSCULAR

## 2021-07-31 NOTE — Progress Notes (Signed)
Per orders of Dr. Ethelene Hal Pt is here for testosterone injection, pt received '200mg'$ /73m injection in right lateral superior gluteal area given by SLevada Schilling W[place] Pt tolerated injection well. No adverse reactions after Pt received injection.

## 2021-08-14 ENCOUNTER — Other Ambulatory Visit: Payer: Self-pay

## 2021-08-14 ENCOUNTER — Ambulatory Visit (INDEPENDENT_AMBULATORY_CARE_PROVIDER_SITE_OTHER): Payer: 59

## 2021-08-14 DIAGNOSIS — E291 Testicular hypofunction: Secondary | ICD-10-CM | POA: Diagnosis not present

## 2021-08-14 NOTE — Progress Notes (Signed)
Per the orders of PCP Dr. Ethelene Hal pt is here for testosterone injection, pt received injection in left upper outer quadrant of the glute , injection given by [place] CMA/CPT at 3:35pm  pt tolerated injection well.

## 2021-09-11 ENCOUNTER — Ambulatory Visit (INDEPENDENT_AMBULATORY_CARE_PROVIDER_SITE_OTHER): Payer: 59

## 2021-09-11 ENCOUNTER — Other Ambulatory Visit: Payer: Self-pay

## 2021-09-11 DIAGNOSIS — E291 Testicular hypofunction: Secondary | ICD-10-CM

## 2021-09-11 NOTE — Progress Notes (Signed)
Per the orders of Dr. Ethelene Hal pt is here for testosterone injection, pt received injection in right upper outer quadrant of the glute, pt tolerated injection well. Given by [place] CMA/CPT at 10:00 am. Pt will return in two weeks for next injection.

## 2021-09-25 ENCOUNTER — Ambulatory Visit (INDEPENDENT_AMBULATORY_CARE_PROVIDER_SITE_OTHER): Payer: 59

## 2021-09-25 ENCOUNTER — Other Ambulatory Visit: Payer: Self-pay

## 2021-09-25 DIAGNOSIS — E291 Testicular hypofunction: Secondary | ICD-10-CM

## 2021-09-25 MED ORDER — TESTOSTERONE CYPIONATE 200 MG/ML IM SOLN
200.0000 mg | INTRAMUSCULAR | Status: DC
  Administered 2021-09-25: 200 mg via INTRAMUSCULAR

## 2021-09-25 NOTE — Progress Notes (Signed)
Per orders of Dr. Ethelene Hal Pt is here for testosterone injection, pt received injection in left upper outer quadrant area of the buttocks, given by Jametta Moore[place] W. Pt tolerated injection well. Pt was instructed to return in 2 weeks

## 2021-10-18 ENCOUNTER — Telehealth: Payer: Self-pay | Admitting: Family Medicine

## 2021-10-18 NOTE — Telephone Encounter (Signed)
Pt called a said that he has been getting the testosterone injection every 2 weeks and said that he called the pharmacy and they said that they arent eligible to fill until the end of the month and advised him to call here, they said they would need [place] to call pharmacy and approve feeling before then. Please advise. Pt would like a call back when done [phone].

## 2021-10-18 NOTE — Telephone Encounter (Signed)
Spoke with patient who verbally understood insurance will not cover Rx until November 23rd. Appointment scheduled for patient to come in on this day to have injection.

## 2021-10-31 ENCOUNTER — Ambulatory Visit: Payer: 59

## 2021-11-28 ENCOUNTER — Other Ambulatory Visit: Payer: Self-pay

## 2021-11-29 ENCOUNTER — Ambulatory Visit: Payer: 59

## 2021-12-11 ENCOUNTER — Other Ambulatory Visit: Payer: Self-pay

## 2021-12-11 DIAGNOSIS — E291 Testicular hypofunction: Secondary | ICD-10-CM

## 2021-12-11 MED ORDER — TESTOSTERONE CYPIONATE 200 MG/ML IM SOLN: 200.0000 mg | INTRAMUSCULAR | 3 refills | Status: DC

## 2021-12-11 NOTE — Telephone Encounter (Signed)
Refill request for pending Rx last refill/OV 06/09/21 please advise.

## 2021-12-12 ENCOUNTER — Telehealth: Payer: Self-pay | Admitting: Family Medicine

## 2021-12-13 ENCOUNTER — Encounter: Payer: Self-pay | Admitting: Family Medicine

## 2021-12-13 ENCOUNTER — Other Ambulatory Visit: Payer: Self-pay

## 2021-12-13 ENCOUNTER — Ambulatory Visit: Payer: Self-pay

## 2021-12-13 ENCOUNTER — Ambulatory Visit (INDEPENDENT_AMBULATORY_CARE_PROVIDER_SITE_OTHER): Payer: Managed Care, Other (non HMO) | Admitting: Family Medicine

## 2021-12-13 VITALS — BP 132/78 | HR 63 | Temp 97.0°F | Ht 70.0 in | Wt 214.4 lb

## 2021-12-13 DIAGNOSIS — E291 Testicular hypofunction: Secondary | ICD-10-CM

## 2021-12-13 DIAGNOSIS — Z23 Encounter for immunization: Secondary | ICD-10-CM

## 2021-12-13 DIAGNOSIS — Z1211 Encounter for screening for malignant neoplasm of colon: Secondary | ICD-10-CM

## 2021-12-13 DIAGNOSIS — B349 Viral infection, unspecified: Secondary | ICD-10-CM | POA: Diagnosis not present

## 2021-12-13 LAB — CBC
HCT: 44.2 % (ref 39.0–52.0)
Hemoglobin: 14.5 g/dL (ref 13.0–17.0)
MCHC: 32.8 g/dL (ref 30.0–36.0)
MCV: 94.4 fl (ref 78.0–100.0)
Platelets: 334 10*3/uL (ref 150.0–400.0)
RBC: 4.68 Mil/uL (ref 4.22–5.81)
RDW: 13.6 % (ref 11.5–15.5)
WBC: 6.2 10*3/uL (ref 4.0–10.5)

## 2021-12-13 MED ORDER — TESTOSTERONE CYPIONATE 200 MG/ML IM SOLN
200.0000 mg | INTRAMUSCULAR | Status: DC
Start: 2021-12-13 — End: 2021-12-13
  Administered 2021-12-13: 200 mg via INTRAMUSCULAR

## 2021-12-13 NOTE — Progress Notes (Signed)
Established Patient Office Visit  Subjective:  Patient ID: Tyler Ferguson, male    DOB: [birth year]  Age: 56 y.o. MRN: [id number]  CC:  Chief Complaint  Patient presents with   Follow-up    6 month follow up, no concerns. Patient not fasting. Also need testosterone injection    HPI Tyler Ferguson presents for follow-up of androgen deficiency.  Doing well with the testosterone injections.  No issues there.  Chart review shows that he is Due for colonoscopy, Tdap and Shingrix vaccines.  Developed URI symptoms 10 days ago symptom-free resolving save dry cough without wheezing or difficulty breathing.  There is been no recent fever or chills no asthma history.  Continues to lead an active lifestyle.  Goes to the gym 5 days weekly  Reminds me of his wife's death from [place] in early January 05, 2020.  No past medical history on file.  Past Surgical History:  Procedure Laterality Date   BACK SURGERY  2007    Family History  Problem Relation Age of Onset   Heart disease Mother    Cancer Father    Cancer Sister     Social History   Socioeconomic History   Marital status: Married    Spouse name: Not on file   Number of children: Not on file   Years of education: Not on file   Highest education level: Not on file  Occupational History   Not on file  Tobacco Use   Smoking status: Former   Smokeless tobacco: Never  Vaping Use   Vaping Use: Never used  Substance and Sexual Activity   Alcohol use: Yes    Alcohol/week: 1.0 standard drink    Types: 1 Cans of beer per week    Comment: social   Drug use: Never   Sexual activity: Not on file  Other Topics Concern   Not on file  Social History Narrative   Not on file   Social Determinants of Health   Financial Resource Strain: Not on file  Food Insecurity: Not on file  Transportation Needs: Not on file  Physical Activity: Not on file  Stress: Not on file  Social Connections: Not on file  Intimate Partner Violence: Not on  file    Outpatient Medications Prior to Visit  Medication Sig Dispense Refill   Alanine POWD by Does not apply route.     Amino Acids (AMINO ACID PO) Take by mouth.     CREATINE MONOHYDRATE PO Take by mouth.     Linoleic Acid-Sunflower Oil (CLA PO) Take by mouth.     Multiple Vitamin (MULTIVITAMIN) capsule Take 1 capsule by mouth daily.     Nutritional Supplements (JUVEN/HMB PO) Take by mouth.     Omega-3 1000 MG CAPS Take 1 mg by mouth daily.     sildenafil (VIAGRA) 100 MG tablet Take 0.5-1 tablets (50-100 mg total) by mouth daily as needed for erectile dysfunction. 20 tablet 5   testosterone cypionate (DEPOTESTOSTERONE CYPIONATE) 200 MG/ML injection Inject 1 mL (200 mg total) into the muscle every 14 (fourteen) days. 10 mL 3   ibuprofen (ADVIL) 100 MG/5ML suspension Take 200 mg by mouth every 4 (four) hours as needed. (Patient not taking: Reported on 12/13/2021)     penicillin v potassium (VEETID) 250 MG tablet Take 250 mg by mouth 4 (four) times daily. (Patient not taking: Reported on 12/13/2021)     Facility-Administered Medications Prior to Visit  Medication Dose Route Frequency Provider Last Rate Last Admin  testosterone cypionate (DEPOTESTOSTERONE CYPIONATE) injection 200 mg  200 mg Intramuscular Q14 Days Cirigliano, Mary K, DO   200 mg at 08/16/20 1520   testosterone cypionate (DEPOTESTOSTERONE CYPIONATE) injection 200 mg  200 mg Intramuscular Q14 Days [name], MD   200 mg at 09/13/20 0831   testosterone cypionate (DEPOTESTOSTERONE CYPIONATE) injection 200 mg  200 mg Intramuscular Q14 Days [name], MD   200 mg at 06/19/21 1010   testosterone cypionate (DEPOTESTOSTERONE CYPIONATE) injection 200 mg  200 mg Intramuscular Q14 Days [name], MD       testosterone cypionate (DEPOTESTOSTERONE CYPIONATE) injection 200 mg  200 mg Intramuscular Q14 Days [name], MD   200 mg at 07/17/21 0915   testosterone cypionate (DEPOTESTOSTERONE  CYPIONATE) injection 200 mg  200 mg Intramuscular Q14 Days [name], MD   200 mg at 09/11/21 1000   testosterone cypionate (DEPOTESTOSTERONE CYPIONATE) injection 200 mg  200 mg Intramuscular Q14 Days [name], MD   200 mg at 09/25/21 3846    No Known Allergies  ROS Review of Systems  Constitutional:  Negative for chills, diaphoresis, fatigue, fever and unexpected weight change.  HENT:  Positive for congestion. Negative for postnasal drip, rhinorrhea, sinus pressure and sinus pain.   Eyes:  Negative for photophobia and visual disturbance.  Respiratory:  Positive for cough. Negative for shortness of breath and wheezing.   Cardiovascular: Negative.   Gastrointestinal: Negative.   Musculoskeletal:  Negative for arthralgias and myalgias.  Skin: Negative.   Neurological:  Negative for weakness and headaches.     Objective:    Physical Exam  BP 132/78 (BP Location: Right Arm, Patient Position: Sitting, Cuff Size: Normal)    Pulse 63    Temp (!) 97 F (36.1 C) (Temporal)    Ht 5\' 10"  (1.778 m)    Wt 214 lb 6.4 oz (97.3 kg)    SpO2 97%    BMI 30.76 kg/m  Wt Readings from Last 3 Encounters:  12/13/21 214 lb 6.4 oz (97.3 kg)  06/12/21 216 lb 6.4 oz (98.2 kg)  08/09/20 222 lb 3.2 oz (100.8 kg)     Health Maintenance Due  Topic Date Due   HIV Screening  Never done   Hepatitis C Screening  Never done   COLONOSCOPY (Pts 45-45yrs Insurance coverage will need to be confirmed)  Never done   Zoster Vaccines- Shingrix (1 of 2) Never done   TETANUS/TDAP  06/17/2021    There are no preventive care reminders to display for this patient.  No results found for: TSH Lab Results  Component Value Date   WBC 5.6 06/12/2021   HGB 14.5 06/12/2021   HCT 42.6 06/12/2021   MCV 94.0 06/12/2021   PLT 265.0 06/12/2021   Lab Results  Component Value Date   NA 138 06/12/2021   K 4.3 06/12/2021   CO2 29 06/12/2021   GLUCOSE 97 06/12/2021   BUN 16 06/12/2021    CREATININE 0.68 06/12/2021   BILITOT 0.4 06/12/2021   ALKPHOS 48 06/12/2021   AST 22 06/12/2021   ALT 24 06/12/2021   PROT 6.5 06/12/2021   ALBUMIN 4.0 06/12/2021   CALCIUM 9.3 06/12/2021   GFR 103.32 06/12/2021   Lab Results  Component Value Date   CHOL 172 03/22/2020   Lab Results  Component Value Date   HDL 52.60 03/22/2020   Lab Results  Component Value Date   LDLCALC 106 (H) 03/22/2020   Lab Results  Component  Value Date   TRIG 66.0 03/22/2020   Lab Results  Component Value Date   CHOLHDL 3 03/22/2020   No results found for: HGBA1C    Assessment & Plan:   Problem List Items Addressed This Visit       Endocrine   Androgen deficiency - Primary   Relevant Orders   CBC     Other   Colon cancer screening   Relevant Orders   Ambulatory referral to Gastroenterology   Viral syndrome   Relevant Orders   CBC   COVID-19, Flu A+B and RSV   Need for shingles vaccine   Relevant Orders   Varicella-zoster vaccine IM (Shingrix)   Need for Tdap vaccination   Relevant Orders   Tdap vaccine greater than or equal to 7yo IM    No orders of the defined types were placed in this encounter.   Follow-up: Return in about 6 months (around 06/12/2022), or if symptoms worsen or fail to improve, for RETURN IN 2-6 MONTHS FOR SECOND SHINGRIX VACCINE. Marland Kitchen    [name], MD

## 2021-12-14 NOTE — Progress Notes (Signed)
This encounter was created in error - please disregard.

## 2021-12-15 LAB — COVID-19, FLU A+B AND RSV
Influenza A, NAA: NOT DETECTED
Influenza B, NAA: NOT DETECTED
RSV, NAA: NOT DETECTED
SARS-CoV-2, NAA: NOT DETECTED

## 2021-12-25 ENCOUNTER — Telehealth: Payer: Self-pay | Admitting: Family Medicine

## 2021-12-25 NOTE — Telephone Encounter (Signed)
Patient aware of negative results.

## 2021-12-27 ENCOUNTER — Ambulatory Visit (INDEPENDENT_AMBULATORY_CARE_PROVIDER_SITE_OTHER): Payer: Managed Care, Other (non HMO)

## 2021-12-27 ENCOUNTER — Other Ambulatory Visit: Payer: Self-pay

## 2021-12-27 ENCOUNTER — Ambulatory Visit: Payer: Managed Care, Other (non HMO)

## 2021-12-27 DIAGNOSIS — E291 Testicular hypofunction: Secondary | ICD-10-CM

## 2021-12-27 MED ORDER — TESTOSTERONE CYPIONATE 200 MG/ML IM SOLN
200.0000 mg | INTRAMUSCULAR | Status: DC
  Administered 2021-12-27: 200 mg via INTRAMUSCULAR

## 2021-12-27 NOTE — Progress Notes (Signed)
Per orders of Dr. Ethelene Hal Pt is here for testosterone injection, pt received injection IM in theright upper outer quadrant area of the buttocks, given by Riyaan Heroux W. Pt tolerated injection well. Pt was instructed to return in 2 weeks

## 2022-01-23 ENCOUNTER — Other Ambulatory Visit: Payer: Self-pay

## 2022-01-23 ENCOUNTER — Encounter: Payer: Self-pay | Admitting: Gastroenterology

## 2022-01-24 ENCOUNTER — Ambulatory Visit (INDEPENDENT_AMBULATORY_CARE_PROVIDER_SITE_OTHER): Payer: Managed Care, Other (non HMO)

## 2022-01-24 DIAGNOSIS — E291 Testicular hypofunction: Secondary | ICD-10-CM | POA: Diagnosis not present

## 2022-01-24 MED ORDER — TESTOSTERONE CYPIONATE 200 MG/ML IM SOLN
200.0000 mg | INTRAMUSCULAR | Status: DC
  Administered 2022-01-24: 200 mg via INTRAMUSCULAR

## 2022-01-24 NOTE — Progress Notes (Signed)
Per the orders of Dr.Kremer pt is here testosterone injection, pt received injection in the right upper outer quadrant of the glute. Given by [place] CMA/CPT at 10:15 am. Pt tolerated injection well pt will return in two weeks for next injection.

## 2022-02-21 ENCOUNTER — Telehealth: Payer: Self-pay

## 2022-02-21 ENCOUNTER — Ambulatory Visit (INDEPENDENT_AMBULATORY_CARE_PROVIDER_SITE_OTHER): Payer: Managed Care, Other (non HMO)

## 2022-02-21 ENCOUNTER — Other Ambulatory Visit: Payer: Self-pay

## 2022-02-21 DIAGNOSIS — E291 Testicular hypofunction: Secondary | ICD-10-CM | POA: Diagnosis not present

## 2022-02-21 DIAGNOSIS — Z23 Encounter for immunization: Secondary | ICD-10-CM

## 2022-02-21 MED ORDER — TESTOSTERONE CYPIONATE 100 MG/ML IM SOLN
200.0000 mg | INTRAMUSCULAR | Status: DC
  Administered 2022-02-21: 200 mg via INTRAMUSCULAR

## 2022-02-21 NOTE — Telephone Encounter (Signed)
Patient in office for 2 week testosterone injection. Per patient he's due for next injection in 2 weeks he will be out of town to Thursday which will make 2 weeks patient would like to know if it would be okay to have testosterone one day before the 2 week mark on 13 days instead of 14 days? Please advise.  ?

## 2022-02-21 NOTE — Progress Notes (Signed)
After obtaining consent, and per orders of Dr. Ethelene Hal, injection of Shingles vaccine in right deltoid and testosterone injection right glut given by Lynda Rainwater. Patient instructed to remain in clinic for 20 minutes afterwards, and to report any adverse reaction to me immediately.  ?

## 2022-03-06 ENCOUNTER — Ambulatory Visit: Payer: Managed Care, Other (non HMO)

## 2022-03-06 DIAGNOSIS — E291 Testicular hypofunction: Secondary | ICD-10-CM

## 2022-03-14 ENCOUNTER — Ambulatory Visit (AMBULATORY_SURGERY_CENTER): Payer: Managed Care, Other (non HMO) | Admitting: *Deleted

## 2022-03-14 ENCOUNTER — Encounter: Payer: Self-pay | Admitting: Gastroenterology

## 2022-03-14 VITALS — Ht 70.0 in | Wt 214.0 lb

## 2022-03-14 DIAGNOSIS — Z1211 Encounter for screening for malignant neoplasm of colon: Secondary | ICD-10-CM

## 2022-03-14 MED ORDER — PLENVU 140 G PO SOLR: 1.0000 | Freq: Once | ORAL | 0 refills | Status: AC

## 2022-03-14 NOTE — Progress Notes (Signed)
Patient's pre-visit was done today over the phone with the patient. Name,DOB and address verified. Patient denies any allergies to Eggs and Soy. Patient denies any problems with anesthesia/sedation. Patient is not taking any diet pills or blood thinners. No home Oxygen. Insurance confirmed with patient. ? ?Prep instructions mailed to pt-pt is aware. Patient understands to call us back with any questions or concerns. Patient is aware of our care-partner policy. Plenvu coupon attached to RX and mailed to pt=pt is aware.  ? ? ?The patient is COVID-19 vaccinated.   ?

## 2022-03-17 ENCOUNTER — Encounter: Payer: Self-pay | Admitting: Certified Registered Nurse Anesthetist

## 2022-03-20 ENCOUNTER — Telehealth: Payer: Self-pay | Admitting: Gastroenterology

## 2022-03-20 NOTE — Telephone Encounter (Signed)
Inbound call from patient stating that he is out of town and will not be back until Saturday and he does not have his prep instructions and does not use Mychart. Patient is seeking advice if his instructions can be emailed. Please advise.   ?

## 2022-03-20 NOTE — Telephone Encounter (Signed)
Resent instructions in email to [email or web address] - called pt, LM this was done  ?

## 2022-03-20 NOTE — Telephone Encounter (Signed)
Inbound call from patient reports when he click on link in the email, it is blank ?

## 2022-03-20 NOTE — Telephone Encounter (Signed)
EMAILED without any personal information to pt at  [email or web address] per pt request- he states he is out of town until Saturday night, he will pick up Plenvu prep Sunday to start Sunday pm per pt  ? ?Lelan Pons PV  ? ? ?

## 2022-03-24 ENCOUNTER — Encounter: Payer: Self-pay | Admitting: Certified Registered Nurse Anesthetist

## 2022-03-25 ENCOUNTER — Encounter: Payer: Self-pay | Admitting: Gastroenterology

## 2022-03-25 ENCOUNTER — Ambulatory Visit (AMBULATORY_SURGERY_CENTER): Payer: Managed Care, Other (non HMO) | Admitting: Gastroenterology

## 2022-03-25 VITALS — BP 116/68 | HR 46 | Temp 98.0°F | Resp 11 | Ht 70.0 in | Wt 214.0 lb

## 2022-03-25 DIAGNOSIS — D122 Benign neoplasm of ascending colon: Secondary | ICD-10-CM

## 2022-03-25 DIAGNOSIS — D123 Benign neoplasm of transverse colon: Secondary | ICD-10-CM | POA: Diagnosis not present

## 2022-03-25 DIAGNOSIS — Z1211 Encounter for screening for malignant neoplasm of colon: Secondary | ICD-10-CM

## 2022-03-25 DIAGNOSIS — D124 Benign neoplasm of descending colon: Secondary | ICD-10-CM

## 2022-03-25 HISTORY — PX: COLONOSCOPY: SHX174

## 2022-03-25 MED ORDER — SODIUM CHLORIDE 0.9 % IV SOLN: 500.0000 mL | Freq: Once | INTRAVENOUS | Status: DC

## 2022-03-25 NOTE — Progress Notes (Signed)
Called to room to assist during endoscopic procedure.  Patient ID and intended procedure confirmed with present staff. Received instructions for my participation in the procedure from the performing physician.  

## 2022-03-25 NOTE — Progress Notes (Signed)
Report given to PACU, vss 

## 2022-03-25 NOTE — Op Note (Signed)
[place] ?Patient Name: Tyler Ferguson ?Procedure Date: 03/25/2022 9:33 AM ?MRN: [id number] ?Endoscopist: Carlota Raspberry. Havery Moros , MD ?Age: 56 ?Referring MD:  ?Date of Birth: 01-08-1966 ?Gender: Male ?Account #: [id number] ?Procedure:                Colonoscopy ?Indications:              Screening for colorectal malignant neoplasm, This  ?                          is the patient's first colonoscopy ?Medicines:                Monitored Anesthesia Care ?Procedure:                Pre-Anesthesia Assessment: ?                          - Prior to the procedure, a History and Physical  ?                          was performed, and patient medications and  ?                          allergies were reviewed. The patient's tolerance of  ?                          previous anesthesia was also reviewed. The risks  ?                          and benefits of the procedure and the sedation  ?                          options and risks were discussed with the patient.  ?                          All questions were answered, and informed consent  ?                          was obtained. Prior Anticoagulants: The patient has  ?                          taken no previous anticoagulant or antiplatelet  ?                          agents. ASA Grade Assessment: I - A normal, healthy  ?                          patient. After reviewing the risks and benefits,  ?                          the patient was deemed in satisfactory condition to  ?                          undergo the procedure. ?  After obtaining informed consent, the colonoscope  ?                          was passed under direct vision. Throughout the  ?                          procedure, the patient's blood pressure, pulse, and  ?                          oxygen saturations were monitored continuously. The  ?                          CF [id number] #[id number] was introduced through the anus  ?                          and advanced to the the cecum,  identified by  ?                          appendiceal orifice and ileocecal valve. The  ?                          colonoscopy was performed without difficulty. The  ?                          patient tolerated the procedure well. The quality  ?                          of the bowel preparation was good. The ileocecal  ?                          valve, appendiceal orifice, and rectum were  ?                          photographed. ?Scope In: 9:48:50 AM ?Scope Out: 10:12:59 AM ?Scope Withdrawal Time: 0 hours 19 minutes 25 seconds  ?Total Procedure Duration: 0 hours 24 minutes 9 seconds  ?Findings:                 Prolapsing hemorrhoids were found on perianal exam. ?                          A 4 mm polyp was found in the ascending colon. The  ?                          polyp was flat. The polyp was removed with a cold  ?                          snare. Resection and retrieval were complete. ?                          Two flat and sessile polyps were found in the  ?                          hepatic flexure. The polyps were  3 to 4 mm in size.  ?                          These polyps were removed with a cold snare.  ?                          Resection and retrieval were complete. ?                          Two flat and sessile polyps were found in the  ?                          transverse colon. The polyps were 3 mm in size.  ?                          These polyps were removed with a cold snare.  ?                          Resection and retrieval were complete. ?                          A 5 mm polyp was found in the descending colon. The  ?                          polyp was sessile. The polyp was removed with a  ?                          cold snare. Resection and retrieval were complete. ?                          There was spasm in the entire colon. ?                          A few small-mouthed diverticula were found in the  ?                          sigmoid colon. ?                          Internal hemorrhoids  were found during retroflexion  ?                          and were large in size. ?                          The exam was otherwise without abnormality. ?Complications:            No immediate complications. Estimated blood loss:  ?                          Minimal. ?Estimated Blood Loss:     Estimated blood loss was minimal. ?Impression:               - Hemorrhoids found on perianal exam. ?                          -  One 4 mm polyp in the ascending colon, removed  ?                          with a cold snare. Resected and retrieved. ?                          - Two 3 to 4 mm polyps at the hepatic flexure,  ?                          removed with a cold snare. Resected and retrieved. ?                          - Two 3 mm polyps in the transverse colon, removed  ?                          with a cold snare. Resected and retrieved. ?                          - One 5 mm polyp in the descending colon, removed  ?                          with a cold snare. Resected and retrieved. ?                          - Colonic spasm. ?                          - Diverticulosis in the sigmoid colon. ?                          - Internal hemorrhoids. ?                          - The examination was otherwise normal. ?Recommendation:           - Patient has a contact number available for  ?                          emergencies. The signs and symptoms of potential  ?                          delayed complications were discussed with the  ?                          patient. Return to normal activities tomorrow.  ?                          Written discharge instructions were provided to the  ?                          patient. ?                          - Resume previous diet. ?                          -  Continue present medications. ?                          - Await pathology results. ?                          - Consideration for hemorrhoid banding in the  ?                          future if hemorrhoids causing symptoms ?Remo Lipps P.  Elma Limas, MD ?03/25/2022 10:20:03 AM ?This report has been signed electronically. ?

## 2022-03-25 NOTE — Patient Instructions (Signed)
Handouts given on diverticulosis, hemorrhoids, hemorrhoidal banding, and polyps. Await pathology results. ?Resume previous diet and continue present medications. ?Repeat colonoscopy for surveillance will be determined based off of pathology results. ? ? ?YOU HAD AN ENDOSCOPIC PROCEDURE TODAY AT [facility]:   Refer to the procedure report that was given to you for any specific questions about what was found during the examination.  If the procedure report does not answer your questions, please call your gastroenterologist to clarify.  If you requested that your care partner not be given the details of your procedure findings, then the procedure report has been included in a sealed envelope for you to review at your convenience later. ? ?YOU SHOULD EXPECT: Some feelings of bloating in the abdomen. Passage of more gas than usual.  Walking can help get rid of the air that was put into your GI tract during the procedure and reduce the bloating. If you had a lower endoscopy (such as a colonoscopy or flexible sigmoidoscopy) you may notice spotting of blood in your stool or on the toilet paper. If you underwent a bowel prep for your procedure, you may not have a normal bowel movement for a few days. ? ?Please Note:  You might notice some irritation and congestion in your nose or some drainage.  This is from the oxygen used during your procedure.  There is no need for concern and it should clear up in a day or so. ? ?SYMPTOMS TO REPORT IMMEDIATELY: ? ?Following lower endoscopy (colonoscopy or flexible sigmoidoscopy): ? Excessive amounts of blood in the stool ? Significant tenderness or worsening of abdominal pains ? Swelling of the abdomen that is new, acute ? Fever of 100?F or higher ? ?For urgent or emergent issues, a gastroenterologist can be reached at any hour by calling [phone]. ?Do not use MyChart messaging for urgent concerns.  ? ? ?DIET:  We do recommend a small meal at first, but then you  may proceed to your regular diet.  Drink plenty of fluids but you should avoid alcoholic beverages for 24 hours. ? ?ACTIVITY:  You should plan to take it easy for the rest of today and you should NOT DRIVE or use heavy machinery until tomorrow (because of the sedation medicines used during the test).   ? ?FOLLOW UP: ?Our staff will call the number listed on your records 48-72 hours following your procedure to check on you and address any questions or concerns that you may have regarding the information given to you following your procedure. If we do not reach you, we will leave a message.  We will attempt to reach you two times.  During this call, we will ask if you have developed any symptoms of COVID 19. If you develop any symptoms (ie: fever, flu-like symptoms, shortness of breath, cough etc.) before then, please call [phone].  If you test positive for Covid 19 in the 2 weeks post procedure, please call and report this information to [place].   ? ?If any biopsies were taken you will be contacted by phone or by letter within the next 1-3 weeks.  Please call us at [phone] if you have not heard about the biopsies in 3 weeks.  ? ? ?SIGNATURES/CONFIDENTIALITY: ?You and/or your care partner have signed paperwork which will be entered into your electronic medical record.  These signatures attest to the fact that that the information above on your After Visit Summary has been reviewed and is understood.  Full responsibility  of the confidentiality of this discharge information lies with you and/or your care-partner.  ?

## 2022-03-25 NOTE — Progress Notes (Signed)
Pt's states no medical or surgical changes since previsit or office visit. VS assessed by C.W 

## 2022-03-25 NOTE — Progress Notes (Signed)
[facility] History and Physical ? ? ?Primary Care Physician:  [name], MD ? ? ?Reason for Procedure:   Colon cancer screening - first time exam ? ?Plan:    colonoscopy ? ? ? ? ?HPI: Tyler Ferguson is a 56 y.o. male  here for colonoscopy screening - first time exam. Patient denies any bowel symptoms at this time. No family history of colon cancer known. Otherwise feels well without any cardiopulmonary symptoms.  ? ? ?History reviewed. No pertinent past medical history. ? ?Past Surgical History:  ?Procedure Laterality Date  ? BACK SURGERY  2007  ? ? ?Prior to Admission medications   ?Medication Sig Start Date End Date Taking? Authorizing Provider  ?Alanine POWD by Does not apply route.   Yes [provider]  ?Amino Acids (AMINO ACID PO) Take by mouth.   Yes [provider]  ?CREATINE MONOHYDRATE PO Take by mouth.   Yes [provider]  ?Multiple Vitamin (MULTIVITAMIN) capsule Take 1 capsule by mouth daily.   Yes [provider]  ?NON FORMULARY HMB/Vit D3   Yes [provider]  ?Omega-3 1000 MG CAPS Take 1 mg by mouth daily.   Yes [provider]  ?sildenafil (VIAGRA) 100 MG tablet Take 0.5-1 tablets (50-100 mg total) by mouth daily as needed for erectile dysfunction. 06/04/21  Yes [name], MD  ? ? ?Current Outpatient Medications  ?Medication Sig Dispense Refill  ? Alanine POWD by Does not apply route.    ? Amino Acids (AMINO ACID PO) Take by mouth.    ? CREATINE MONOHYDRATE PO Take by mouth.    ? Multiple Vitamin (MULTIVITAMIN) capsule Take 1 capsule by mouth daily.    ? NON FORMULARY HMB/Vit D3    ? Omega-3 1000 MG CAPS Take 1 mg by mouth daily.    ? sildenafil (VIAGRA) 100 MG tablet Take 0.5-1 tablets (50-100 mg total) by mouth daily as needed for erectile dysfunction. 20 tablet 5  ? ?Current Facility-Administered Medications  ?Medication Dose Route Frequency Provider Last Rate Last Admin  ? 0.9 %  sodium chloride  infusion  500 mL Intravenous Once Kassie Keng, Carlota Raspberry, MD      ? testosterone cypionate (DEPOTESTOSTERONE CYPIONATE) injection 200 mg  200 mg Intramuscular Q14 Days Cirigliano, Mary K, DO   200 mg at 08/16/20 1520  ? testosterone cypionate (DEPOTESTOSTERONE CYPIONATE) injection 200 mg  200 mg Intramuscular Q14 Days [name], MD   200 mg at 09/13/20 0831  ? testosterone cypionate (DEPOTESTOSTERONE CYPIONATE) injection 200 mg  200 mg Intramuscular Q14 Days [name], MD   200 mg at 06/19/21 1010  ? testosterone cypionate (DEPOTESTOSTERONE CYPIONATE) injection 200 mg  200 mg Intramuscular Q14 Days [name], MD      ? testosterone cypionate (DEPOTESTOSTERONE CYPIONATE) injection 200 mg  200 mg Intramuscular Q14 Days [name], MD   200 mg at 07/17/21 0915  ? testosterone cypionate (DEPOTESTOSTERONE CYPIONATE) injection 200 mg  200 mg Intramuscular Q14 Days [name], MD   200 mg at 09/11/21 1000  ? testosterone cypionate (DEPOTESTOSTERONE CYPIONATE) injection 200 mg  200 mg Intramuscular Q14 Days [name], MD   200 mg at 09/25/21 2878  ? testosterone cypionate (DEPOTESTOTERONE CYPIONATE) injection 200 mg  200 mg Intramuscular Q14 Days [name], MD   200 mg at 02/21/22 0932  ? ? ?Allergies as of 03/25/2022  ? (No Known Allergies)  ? ? ?Family History  ?Problem Relation  Age of Onset  ? Heart disease Mother   ? Cancer Father   ? Cancer Sister   ? Colon cancer Neg Hx   ? Colon polyps Neg Hx   ? Esophageal cancer Neg Hx   ? Rectal cancer Neg Hx   ? Stomach cancer Neg Hx   ? ? ?Social History  ? ?Socioeconomic History  ? Marital status: Married  ?  Spouse name: Not on file  ? Number of children: Not on file  ? Years of education: Not on file  ? Highest education level: Not on file  ?Occupational History  ? Not on file  ?Tobacco Use  ? Smoking status: Former  ? Smokeless tobacco: Never  ?Vaping Use  ? Vaping Use: Never used   ?Substance and Sexual Activity  ? Alcohol use: Yes  ?  Alcohol/week: 3.0 standard drinks  ?  Types: 3 Cans of beer per week  ?  Comment: social  ? Drug use: Never  ? Sexual activity: Not on file  ?Other Topics Concern  ? Not on file  ?Social History Narrative  ? Not on file  ? ?Social Determinants of Health  ? ?Financial Resource Strain: Not on file  ?Food Insecurity: Not on file  ?Transportation Needs: Not on file  ?Physical Activity: Not on file  ?Stress: Not on file  ?Social Connections: Not on file  ?Intimate Partner Violence: Not on file  ? ? ?Review of Systems: ?All other review of systems negative except as mentioned in the HPI. ? ?Physical Exam: ?Vital signs ?BP 116/67   Pulse (!) 51   Temp 98 ?F (36.7 ?C) (Skin)   Resp 16   Ht '5\' 10"'$  (1.778 m)   Wt 214 lb (97.1 kg)   SpO2 97%   BMI 30.71 kg/m?  ? ?General:   Alert,  Well-developed, pleasant and cooperative in NAD ?Lungs:  Clear throughout to auscultation.   ?Heart:  Regular rate and rhythm ?Abdomen:  Soft, nontender and nondistended.   ?Neuro/Psych:  Alert and cooperative. Normal mood and affect. A and O x 3 ? ?Jolly Mango, MD ?[facility] ? ? ?

## 2022-03-26 ENCOUNTER — Telehealth: Payer: Self-pay

## 2022-03-26 ENCOUNTER — Encounter: Payer: Self-pay | Admitting: Family Medicine

## 2022-03-26 ENCOUNTER — Ambulatory Visit (INDEPENDENT_AMBULATORY_CARE_PROVIDER_SITE_OTHER): Payer: Managed Care, Other (non HMO) | Admitting: Family Medicine

## 2022-03-26 ENCOUNTER — Ambulatory Visit: Payer: Managed Care, Other (non HMO)

## 2022-03-26 VITALS — BP 138/80 | HR 59 | Temp 97.3°F | Ht 70.0 in | Wt 215.8 lb

## 2022-03-26 DIAGNOSIS — E291 Testicular hypofunction: Secondary | ICD-10-CM | POA: Diagnosis not present

## 2022-03-26 DIAGNOSIS — S76301A Unspecified injury of muscle, fascia and tendon of the posterior muscle group at thigh level, right thigh, initial encounter: Secondary | ICD-10-CM | POA: Diagnosis not present

## 2022-03-26 MED ORDER — IBUPROFEN 800 MG PO TABS: 800.0000 mg | ORAL_TABLET | Freq: Three times a day (TID) | ORAL | 0 refills | Status: DC | PRN

## 2022-03-26 MED ORDER — TESTOSTERONE CYPIONATE 200 MG/ML IM SOLN
200.0000 mg | INTRAMUSCULAR | Status: DC
  Administered 2022-03-26 – 2022-04-23 (×3): 200 mg via INTRAMUSCULAR

## 2022-03-26 NOTE — Patient Instructions (Signed)
Take ibuprofen as needed for pain.  ? ?Continue to use heat and ice as needed.  ?

## 2022-03-26 NOTE — Telephone Encounter (Signed)
Called and spoke with patient. He has been scheduled for his 1st hemorrhoid banding on Friday, 04/05/22 at 4 pm. Pt requested that I send him a letter with appt information. Pt verbalized understanding and had no concerns at the end of the call.  ? ?Letter mailed to pt with appt information and hemorrhoid banding brochure. ?

## 2022-03-26 NOTE — Telephone Encounter (Signed)
-----   Message from Yetta Flock, MD sent at 03/25/2022 10:26 AM EDT ----- ?Regarding: banding ?Tyler Ferguson can you coordinate a hemorrhoid banding for this patient - non urgent, had procedure today, maybe can contact him tomorrow or later this week. Thanks ? ?

## 2022-03-26 NOTE — Progress Notes (Signed)
? ?  Acute Office Visit ? ?Subjective:  ? ?  ?Patient ID: Tyler Ferguson, male    DOB: 07-06-1966, 56 y.o.   MRN: [id number] ? ?Chief Complaint  ?Patient presents with  ? Acute Visit  ?  Pt c/o hyper extending his right knee today.  ? ? ?HPI ?Patient is in today for right knee pain.  Patient was at the gym approximately 3 hours ago where he after he finished working out he slipped and hyperextended his right knee.  After this he has taken ibuprofen, iced it and apply heat in the form of shower.  It is improving now.  Patient is able to walk on his own.  At first he thought he felt some instability of his knee but this is resolved. ? ?ROS ?As per HPI ? ?   ?Objective:  ?  ?BP 138/80 (BP Location: Left Arm, Patient Position: Sitting, Cuff Size: Normal)   Pulse (!) 59   Temp (!) 97.3 ?F (36.3 ?C) (Temporal)   Ht '5\' 10"'$  (1.778 m)   Wt 215 lb 12.8 oz (97.9 kg)   SpO2 96%   BMI 30.96 kg/m?  ? ? ?Gen: NAD, resting comfortably ?MSK: Right knee stable with extension, flexion, torquing, valgus and varus stress, tender on posterior right knee on insertion of medial hamstring ?Neuro: grossly normal, moves all extremities ?Psych: Normal affect and thought content ? ? ?No results found for any visits on 03/26/22. ? ? ?   ?Assessment & Plan:  ?Right hamstring injury ?No instability associated with physical exam, symptoms are improving ?Recommend 800 mg ibuprofen every 8 hours as needed ?Continue icing and heat as tolerated ?Recommend break from exercise for a few days ?Return precautions discussed ? ?No orders of the defined types were placed in this encounter. ? ? ?No follow-ups on file. ? ?Bonnita Hollow, MD ? ? ?

## 2022-03-27 ENCOUNTER — Telehealth: Payer: Self-pay | Admitting: *Deleted

## 2022-03-27 ENCOUNTER — Encounter: Payer: Self-pay | Admitting: Gastroenterology

## 2022-03-27 NOTE — Telephone Encounter (Signed)
?  Follow up Call- ? ? ?  03/25/2022  ?  9:02 AM  ?Call back number  ?Post procedure Call Back phone  # [phone]  ?Permission to leave phone message Yes  ?  ? ?Patient questions: ? ?Do you have a fever, pain , or abdominal swelling? No. ?Pain Score  0 * ? ?Have you tolerated food without any problems? Yes.   ? ?Have you been able to return to your normal activities? Yes.   ? ?Do you have any questions about your discharge instructions: ?Diet   No. ?Medications  No. ?Follow up visit  No. ? ?Do you have questions or concerns about your Care? No. ? ?Actions: ?* If pain score is 4 or above: ?No action needed, pain <4. ? ? ?

## 2022-04-04 ENCOUNTER — Other Ambulatory Visit: Payer: Self-pay | Admitting: Family Medicine

## 2022-04-04 NOTE — Addendum Note (Signed)
Addended by: Josephine Igo B on: 04/04/2022 12:22 PM ? ? Modules accepted: Level of Service ? ?

## 2022-04-05 ENCOUNTER — Encounter: Payer: Self-pay | Admitting: Gastroenterology

## 2022-04-05 ENCOUNTER — Ambulatory Visit (INDEPENDENT_AMBULATORY_CARE_PROVIDER_SITE_OTHER): Payer: Managed Care, Other (non HMO) | Admitting: Gastroenterology

## 2022-04-05 VITALS — BP 120/70 | HR 66 | Ht 70.0 in | Wt 212.0 lb

## 2022-04-05 DIAGNOSIS — K641 Second degree hemorrhoids: Secondary | ICD-10-CM

## 2022-04-05 NOTE — Progress Notes (Signed)
56 year old male here for follow-up visit for hemorrhoid banding.  Met him for his colonoscopy on April 17.  He had large hemorrhoids noted on the exam and he has symptoms from use to include grade 2 prolapse, leakage, occasional bleeding.  States he spends a lot of time on the toilet and strains, has been doing that since he had a back surgery at 14 years ago.  Discussed options for his hemorrhoids and after discussion of risks and benefits of banding he wanted to proceed with that today. ? ? ?Colonoscopy 03/25/22: ?Prolapsing hemorrhoids were found on perianal exam. ?- A 4 mm polyp was found in the ascending colon. The polyp was flat. The polyp was ?removed with a cold snare. Resection and retrieval were complete. ?- Two flat and sessile polyps were found in the hepatic flexure. The polyps were 3 to 4 mm in ?size. These polyps were removed with a cold snare. Resection and retrieval were complete. ?- Two flat and sessile polyps were found in the transverse colon. The polyps were 3 mm in ?size. These polyps were removed with a cold snare. Resection and retrieval were complete. ?- A 5 mm polyp was found in the descending colon. The polyp was sessile. The polyp was ?removed with a cold snare. Resection and retrieval were complete. ?- There was spasm in the entire colon. ?- A few small-mouthed diverticula were found in the sigmoid colon. ?- Internal hemorrhoids were found during retroflexion and were large in size. ?- The exam was otherwise without abnormality. ? ?Surgical [P], colon, descending, hepatic flexure, transverse, and ascending, polyp (6) ?- SERRATED MUCOSAL POLYPS (2 FRAGMENTS) WITH MILD BASAL CRYPT GROWTH ABNORMALITY, FAVOR SESSILE ?SERRATED POLYPS, NEGATIVE FOR DYSPLASIA. ?- TUBULAR ADENOMA, NEGATIVE FOR HIGH-GRADE DYSPLASIA. ?- SERRATED MUCOSAL FRAGMENT, FAVOR HYPERPLASTIC POLYP. ? ? ?PROCEDURE NOTE: ?The patient presents with symptomatic grade II  hemorrhoids, requesting rubber band ligation of his/her  hemorrhoidal disease.  All risks, benefits and alternative forms of therapy were described and informed consent was obtained. ? ? ?The anorectum was pre-medicated with 0.125% nitroglycerin ?The decision was made to band the LL internal hemorrhoid, and the Delta O?Regan System was used to perform band ligation without complication.  ?Digital anorectal examination was then performed to assure proper positioning of the band, and to adjust the banded tissue as required. ? The patient was discharged home without pain or other issues.  Dietary and behavioral recommendations were given and along with follow-up instructions.   ?  ?The following adjunctive treatments were recommended: ?Citrucel once daily - minimize time on toilet ? ?The patient will return in 2-4 weeks for  follow-up and possible additional banding as required. ?No complications were encountered and the patient tolerated the procedure well. ? ?Jolly Mango, MD ?[facility] ? ?

## 2022-04-05 NOTE — Patient Instructions (Addendum)
You have been scheduled for your second hemorrhoidal banding on 04/29/22 at 4:00 pm ? ?Please purchase the following medications over the counter and take as directed: ?Citrucel 1 heaping teaspoon once daily. ? ?HEMORRHOID BANDING PROCEDURE  ? ? FOLLOW-UP CARE ? ? ?The procedure you have had should have been relatively painless since the banding of the area involved does not have nerve endings and there is no pain sensation.  The rubber band cuts off the blood supply to the hemorrhoid and the band may fall off as soon as 48 hours after the banding (the band may occasionally be seen in the toilet bowl following a bowel movement). You may notice a temporary feeling of fullness in the rectum which should respond adequately to plain Tylenol? or Motrin?. ? ?Following the banding, avoid strenuous exercise that evening and resume full activity the next day.  A sitz bath (soaking in a warm tub) or bidet is soothing, and can be useful for cleansing the area after bowel movements.   ? ? ?To avoid constipation, take two tablespoons of natural wheat bran, natural oat bran, flax, Benefiber? or any over the counter fiber supplement and increase your water intake to 7-8 glasses daily.   ? ?Unless you have been prescribed anorectal medication, do not put anything inside your rectum for two weeks: No suppositories, enemas, fingers, etc. ? ?Occasionally, you may have more bleeding than usual after the banding procedure.  This is often from the untreated hemorrhoids rather than the treated one.  Don?t be concerned if there is a tablespoon or so of blood.  If there is more blood than this, lie flat with your bottom higher than your head and apply an ice pack to the area. If the bleeding does not stop within a half an hour or if you feel faint, call our office at [phone] or go to the emergency room. ? ?Problems are not common; however, if there is a substantial amount of bleeding, severe pain, chills, fever or difficulty passing  urine (very rare) or other problems, you should call us at [phone] or report to the nearest emergency room. ? ?Do not stay seated continuously for more than 2-3 hours for a day or two after the procedure.  Tighten your buttock muscles 10-15 times every two hours and take 10-15 deep breaths every 1-2 hours.  Do not spend more than a few minutes on the toilet if you cannot empty your bowel; instead re-visit the toilet at a later time. ? ? ? ? ?If you are age 17 or older, your body mass index should be between 23-30. Your Body mass index is 30.42 kg/m?Marland Kitchen If this is out of the aforementioned range listed, please consider follow up with your Primary Care Provider. ? ?If you are age 71 or younger, your body mass index should be between 19-25. Your Body mass index is 30.42 kg/m?Marland Kitchen If this is out of the aformentioned range listed, please consider follow up with your Primary Care Provider.  ? ?_____________________________________________________ ? ?The [facility] providers would like to encourage you to use [facility] to communicate with providers for non-urgent requests or questions.  Due to long hold times on the telephone, sending your provider a message by [facility] may be a faster and more efficient way to get a response.  Please allow 48 business hours for a response.  Please remember that this is for non-urgent requests.  ?_____________________________________________________ ? ?Due to recent changes in healthcare laws, you may see the  results of your imaging and laboratory studies on MyChart before your provider has had a chance to review them.  We understand that in some cases there may be results that are confusing or concerning to you. Not all laboratory results come back in the same time frame and the provider may be waiting for multiple results in order to interpret others.  Please give [place] 48 hours in order for your provider to thoroughly review all the results before contacting the office for clarification of  your results.  ? ?

## 2022-04-08 ENCOUNTER — Telehealth: Payer: Self-pay | Admitting: Gastroenterology

## 2022-04-09 ENCOUNTER — Ambulatory Visit (INDEPENDENT_AMBULATORY_CARE_PROVIDER_SITE_OTHER): Payer: Managed Care, Other (non HMO)

## 2022-04-09 DIAGNOSIS — E291 Testicular hypofunction: Secondary | ICD-10-CM

## 2022-04-09 NOTE — Telephone Encounter (Signed)
error 

## 2022-04-10 NOTE — Progress Notes (Signed)
Per orders of Dr. Ethelene Hal ,pt is here for Testerone  . Pt received injection in letf upper outer quadrant of glut at 10.45am, pt tolerated injection well. Injection given by Laurell Roof. CMA. Pt will return in 2 weeks for next injection. ? ? ? ? ?encounter signed by [place] L. CMA/ CPT.  ? ?

## 2022-04-23 ENCOUNTER — Ambulatory Visit (INDEPENDENT_AMBULATORY_CARE_PROVIDER_SITE_OTHER): Payer: Commercial Managed Care - HMO

## 2022-04-23 DIAGNOSIS — E291 Testicular hypofunction: Secondary | ICD-10-CM

## 2022-04-23 MED ORDER — TESTOSTERONE CYPIONATE 200 MG/ML IM SOLN: 200.0000 mg | INTRAMUSCULAR | Status: DC

## 2022-04-23 NOTE — Patient Instructions (Signed)
Per orders of Dr. Ethelene Hal ,pt is here for testosterone injection ? Pt received injection in right upper outer quadrant of glut at 10.45 am, pt tolerated injection well.  ?Injection given by Cullen. RMA.  ?Pt states he will get next injection after he returns from his vacation.   ?

## 2022-04-23 NOTE — Progress Notes (Signed)
Per orders of Dr. Ethelene Hal ,pt is here for testosterone injection  Pt received injection in right upper outer quadrant of glut at 10.45 am, pt tolerated injection well.  Injection given by [place] G. RMA.  Pt states he will get next injection after he returns from his vacation.

## 2022-04-29 ENCOUNTER — Encounter: Payer: Managed Care, Other (non HMO) | Admitting: Gastroenterology

## 2022-05-10 ENCOUNTER — Encounter: Payer: Managed Care, Other (non HMO) | Admitting: Gastroenterology

## 2022-05-14 ENCOUNTER — Ambulatory Visit: Payer: Commercial Managed Care - HMO | Admitting: Gastroenterology

## 2022-05-14 ENCOUNTER — Ambulatory Visit (INDEPENDENT_AMBULATORY_CARE_PROVIDER_SITE_OTHER): Payer: Commercial Managed Care - HMO

## 2022-05-14 ENCOUNTER — Encounter: Payer: Self-pay | Admitting: Gastroenterology

## 2022-05-14 VITALS — BP 120/70 | HR 62 | Ht 70.0 in | Wt 210.0 lb

## 2022-05-14 DIAGNOSIS — K641 Second degree hemorrhoids: Secondary | ICD-10-CM | POA: Diagnosis not present

## 2022-05-14 DIAGNOSIS — E291 Testicular hypofunction: Secondary | ICD-10-CM

## 2022-05-14 MED ORDER — TESTOSTERONE CYPIONATE 200 MG/ML IM SOLN
200.0000 mg | INTRAMUSCULAR | Status: DC
  Administered 2022-05-14: 200 mg via INTRAMUSCULAR

## 2022-05-14 NOTE — Progress Notes (Signed)
Per orders of Dr Ethelene Hal, injection of Testosterone 200 mg/ml given by Armandina Gemma, cma.  Patient tolerated injection well. Scheduled for next injection on 05/28/22 @ 9:20 am. Dm/cma

## 2022-05-14 NOTE — Progress Notes (Signed)
56 year old male here for follow-up visit for hemorrhoid banding.  Met him for his colonoscopy on April 17.  He had large hemorrhoids noted on the exam and he has symptoms from use to include grade 2 prolapse, leakage, occasional bleeding.  States he spends a lot of time on the toilet and strains, has been doing that since he had a back surgery at 14 years ago.    Banded LL hemorrhoid on 04/05/22.  He has been on Citrucel daily since that time.  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  Hemorrhoids not as bothersome.  He wants to proceed with an additional banding today after discussion of options.     Colonoscopy 03/25/22: Prolapsing hemorrhoids were found on perianal exam. - A 4 mm polyp was found in the ascending colon. The polyp was flat. The polyp was removed with a cold snare. Resection and retrieval were complete. - Two flat and sessile polyps were found in the hepatic flexure. The polyps were 3 to 4 mm in size. These polyps were removed with a cold snare. Resection and retrieval were complete. - Two flat and sessile polyps were found in the transverse colon. The polyps were 3 mm in size. These polyps were removed with a cold snare. Resection and retrieval were complete. - A 5 mm polyp was found in the descending colon. The polyp was sessile. The polyp was removed with a cold snare. Resection and retrieval were complete. - There was spasm in the entire colon. - A few small-mouthed diverticula were found in the sigmoid colon. - Internal hemorrhoids were found during retroflexion and were large in size. - The exam was otherwise without abnormality.   Surgical [P], colon, descending, hepatic flexure, transverse, and ascending, polyp (6) - SERRATED MUCOSAL POLYPS (2 FRAGMENTS) WITH MILD BASAL CRYPT GROWTH ABNORMALITY, FAVOR SESSILE SERRATED POLYPS, NEGATIVE FOR DYSPLASIA. - TUBULAR ADENOMA, NEGATIVE FOR HIGH-GRADE  DYSPLASIA. - SERRATED MUCOSAL FRAGMENT, FAVOR HYPERPLASTIC POLYP.     PROCEDURE NOTE: The patient presents with symptomatic grade II  hemorrhoids, requesting rubber band ligation of his/her hemorrhoidal disease.  All risks, benefits and alternative forms of therapy were described and informed consent was obtained.     The anorectum was pre-medicated with 0.125% nitroglycerin The decision was made to band the RP internal hemorrhoid, and the Arlee was used to perform band ligation without complication.  Digital anorectal examination was then performed to assure proper positioning of the band, and to adjust the banded tissue as required.  The patient was discharged home without pain or other issues.  Dietary and behavioral recommendations were given and along with follow-up instructions.     The following adjunctive treatments were recommended: Citrucel once daily - minimize time on toilet   The patient will return in 2-4 weeks for  follow-up and possible additional banding as required. No complications were encountered and the patient tolerated the procedure well.   Jolly Mango, MD [facility]

## 2022-05-14 NOTE — Patient Instructions (Addendum)
If you are age 58 or older, your body mass index should be between 23-30. Your Body mass index is 30.13 kg/m. If this is out of the aforementioned range listed, please consider follow up with your Primary Care Provider.  If you are age 52 or younger, your body mass index should be between 19-25. Your Body mass index is 30.13 kg/m. If this is out of the aformentioned range listed, please consider follow up with your Primary Care Provider.   ________________________________________________________  The [facility] providers would like to encourage you to use [facility] to communicate with providers for non-urgent requests or questions.  Due to long hold times on the telephone, sending your provider a message by [facility] may be a faster and more efficient way to get a response.  Please allow 48 business hours for a response.  Please remember that this is for non-urgent requests.  _______________________________________________________  Tyler Ferguson PROCEDURE    FOLLOW-UP CARE   The procedure you have had should have been relatively painless since the banding of the area involved does not have nerve endings and there is no pain sensation.  The rubber band cuts off the blood supply to the hemorrhoid and the band may fall off as soon as 48 hours after the banding (the band may occasionally be seen in the toilet bowl following a bowel movement). You may notice a temporary feeling of fullness in the rectum which should respond adequately to plain Tylenol or Motrin.  Following the banding, avoid strenuous exercise that evening and resume full activity the next day.  A sitz bath (soaking in a warm tub) or bidet is soothing, and can be useful for cleansing the area after bowel movements.     To avoid constipation, take two tablespoons of natural wheat bran, natural oat bran, flax, Benefiber or any over the counter fiber supplement and increase your water intake to 7-8 glasses daily.    Unless you  have been prescribed anorectal medication, do not put anything inside your rectum for two weeks: No suppositories, enemas, fingers, etc.  Occasionally, you may have more bleeding than usual after the banding procedure.  This is often from the untreated hemorrhoids rather than the treated one.  Don't be concerned if there is a tablespoon or so of blood.  If there is more blood than this, lie flat with your bottom higher than your head and apply an ice pack to the area. If the bleeding does not stop within a half an hour or if you feel faint, call our office at [phone] or go to the emergency room.  Problems are not common; however, if there is a substantial amount of bleeding, severe pain, chills, fever or difficulty passing urine (very rare) or other problems, you should call us at [phone] or report to the nearest emergency room.  Do not stay seated continuously for more than 2-3 hours for a day or two after the procedure.  Tighten your buttock muscles 10-15 times every two hours and take 10-15 deep breaths every 1-2 hours.  Do not spend more than a few minutes on the toilet if you cannot empty your bowel; instead re-visit the toilet at a later time.    You have been scheduled for your 3rd and final banding on Tuesday, 6-27 at 3:40 pm.  Please call asap if you need to reschedule this appointment.   Thank you for entrusting me with your care and for choosing [facility], Dr. [place] Cellar

## 2022-05-26 ENCOUNTER — Observation Stay (HOSPITAL_COMMUNITY)
Admission: EM | Admit: 2022-05-26 | Discharge: 2022-05-27 | Disposition: A | Discharge status: Home / Self Care | Payer: Commercial Managed Care - HMO | Attending: Surgery | Admitting: Surgery

## 2022-05-26 ENCOUNTER — Encounter (HOSPITAL_COMMUNITY): Payer: Self-pay | Admitting: Emergency Medicine

## 2022-05-26 ENCOUNTER — Emergency Department (HOSPITAL_COMMUNITY): Payer: Commercial Managed Care - HMO

## 2022-05-26 DIAGNOSIS — Z23 Encounter for immunization: Secondary | ICD-10-CM | POA: Insufficient documentation

## 2022-05-26 DIAGNOSIS — Y9389 Activity, other specified: Secondary | ICD-10-CM | POA: Diagnosis not present

## 2022-05-26 DIAGNOSIS — Y92410 Unspecified street and highway as the place of occurrence of the external cause: Secondary | ICD-10-CM | POA: Diagnosis not present

## 2022-05-26 DIAGNOSIS — T797XXA Traumatic subcutaneous emphysema, initial encounter: Secondary | ICD-10-CM | POA: Insufficient documentation

## 2022-05-26 DIAGNOSIS — S3991XA Unspecified injury of abdomen, initial encounter: Secondary | ICD-10-CM | POA: Insufficient documentation

## 2022-05-26 DIAGNOSIS — Z79899 Other long term (current) drug therapy: Secondary | ICD-10-CM | POA: Insufficient documentation

## 2022-05-26 DIAGNOSIS — S270XXA Traumatic pneumothorax, initial encounter: Secondary | ICD-10-CM | POA: Insufficient documentation

## 2022-05-26 DIAGNOSIS — Z87891 Personal history of nicotine dependence: Secondary | ICD-10-CM | POA: Diagnosis not present

## 2022-05-26 DIAGNOSIS — S0990XA Unspecified injury of head, initial encounter: Secondary | ICD-10-CM | POA: Diagnosis not present

## 2022-05-26 DIAGNOSIS — J982 Interstitial emphysema: Secondary | ICD-10-CM

## 2022-05-26 DIAGNOSIS — S2241XA Multiple fractures of ribs, right side, initial encounter for closed fracture: Principal | ICD-10-CM | POA: Insufficient documentation

## 2022-05-26 DIAGNOSIS — M25511 Pain in right shoulder: Secondary | ICD-10-CM | POA: Diagnosis present

## 2022-05-26 LAB — I-STAT CHEM 8, ED
BUN: 21 mg/dL — ABNORMAL HIGH (ref 6–20)
Calcium, Ion: 1.1 mmol/L — ABNORMAL LOW (ref 1.15–1.40)
Chloride: 101 mmol/L (ref 98–111)
Creatinine, Ser: 1.2 mg/dL (ref 0.61–1.24)
Glucose, Bld: 97 mg/dL (ref 70–99)
HCT: 41 % (ref 39.0–52.0)
Hemoglobin: 13.9 g/dL (ref 13.0–17.0)
Potassium: 3.6 mmol/L (ref 3.5–5.1)
Sodium: 138 mmol/L (ref 135–145)
TCO2: 24 mmol/L (ref 22–32)

## 2022-05-26 LAB — COMPREHENSIVE METABOLIC PANEL
ALT: 197 U/L — ABNORMAL HIGH (ref 0–44)
AST: 278 U/L — ABNORMAL HIGH (ref 15–41)
Albumin: 3.6 g/dL (ref 3.5–5.0)
Alkaline Phosphatase: 43 U/L (ref 38–126)
Anion gap: 11 (ref 5–15)
BUN: 19 mg/dL (ref 6–20)
CO2: 24 mmol/L (ref 22–32)
Calcium: 8.3 mg/dL — ABNORMAL LOW (ref 8.9–10.3)
Chloride: 102 mmol/L (ref 98–111)
Creatinine, Ser: 1.04 mg/dL (ref 0.61–1.24)
GFR, Estimated: >60 mL/min (ref >60)
Glucose, Bld: 108 mg/dL — ABNORMAL HIGH (ref 70–99)
Potassium: 3.6 mmol/L (ref 3.5–5.1)
Sodium: 137 mmol/L (ref 135–145)
Total Bilirubin: 0.7 mg/dL (ref 0.3–1.2)
Total Protein: 6.3 g/dL — ABNORMAL LOW (ref 6.5–8.1)

## 2022-05-26 LAB — CBC
HCT: 40.6 % (ref 39.0–52.0)
Hemoglobin: 13.7 g/dL (ref 13.0–17.0)
MCH: 33.1 pg (ref 26.0–34.0)
MCHC: 33.7 g/dL (ref 30.0–36.0)
MCV: 98.1 fL (ref 80.0–100.0)
Platelets: 246 10*3/uL (ref 150–400)
RBC: 4.14 MIL/uL — ABNORMAL LOW (ref 4.22–5.81)
RDW: 13.1 % (ref 11.5–15.5)
WBC: 11.9 10*3/uL — ABNORMAL HIGH (ref 4.0–10.5)
nRBC: 0 % (ref 0.0–0.2)

## 2022-05-26 LAB — SAMPLE TO BLOOD BANK

## 2022-05-26 LAB — LACTIC ACID, PLASMA: Lactic Acid, Venous: 2.8 mmol/L (ref 0.5–1.9)

## 2022-05-26 LAB — PROTIME-INR
INR: 1 (ref 0.8–1.2)
Prothrombin Time: 13.2 seconds (ref 11.4–15.2)

## 2022-05-26 LAB — ETHANOL: Alcohol, Ethyl (B): 152 mg/dL — ABNORMAL HIGH (ref <10)

## 2022-05-26 MED ORDER — TETANUS-DIPHTH-ACELL PERTUSSIS 5-2.5-18.5 LF-MCG/0.5 IM SUSY
0.5000 mL | PREFILLED_SYRINGE | Freq: Once | INTRAMUSCULAR | Status: AC
Start: 2022-05-26 — End: 2022-05-26
  Administered 2022-05-26: 0.5 mL via INTRAMUSCULAR
  Filled 2022-05-26: qty 0.5

## 2022-05-26 MED ORDER — METHOCARBAMOL 500 MG PO TABS: 500.0000 mg | ORAL_TABLET | Freq: Three times a day (TID) | ORAL | Status: DC | PRN

## 2022-05-26 MED ORDER — METHOCARBAMOL 1000 MG/10ML IJ SOLN: 500.0000 mg | Freq: Three times a day (TID) | INTRAVENOUS | Status: DC | PRN

## 2022-05-26 MED ORDER — LACTATED RINGERS IV BOLUS
1000.0000 mL | Freq: Once | INTRAVENOUS | Status: AC
  Administered 2022-05-26: 1000 mL via INTRAVENOUS

## 2022-05-26 MED ORDER — HYDROCODONE-ACETAMINOPHEN 5-325 MG PO TABS
2.0000 | ORAL_TABLET | ORAL | Status: DC | PRN
  Administered 2022-05-27: 2 via ORAL
  Filled 2022-05-26: qty 2

## 2022-05-26 MED ORDER — ENOXAPARIN SODIUM 30 MG/0.3ML IJ SOSY
30.0000 mg | PREFILLED_SYRINGE | Freq: Two times a day (BID) | INTRAMUSCULAR | Status: DC
  Administered 2022-05-27: 30 mg via SUBCUTANEOUS
  Filled 2022-05-26: qty 0.3

## 2022-05-26 MED ORDER — ONDANSETRON HCL 4 MG/2ML IJ SOLN
4.0000 mg | Freq: Four times a day (QID) | INTRAMUSCULAR | Status: DC | PRN
  Administered 2022-05-27: 4 mg via INTRAVENOUS
  Filled 2022-05-26: qty 2

## 2022-05-26 MED ORDER — MORPHINE SULFATE (PF) 2 MG/ML IV SOLN
2.0000 mg | INTRAVENOUS | Status: DC | PRN
  Administered 2022-05-27 (×4): 2 mg via INTRAVENOUS
  Filled 2022-05-26 (×4): qty 1

## 2022-05-26 MED ORDER — MORPHINE SULFATE (PF) 4 MG/ML IV SOLN
6.0000 mg | Freq: Once | INTRAVENOUS | Status: AC
  Administered 2022-05-26: 6 mg via INTRAVENOUS
  Filled 2022-05-26: qty 2

## 2022-05-26 MED ORDER — IOHEXOL 300 MG/ML  SOLN
100.0000 mL | Freq: Once | INTRAMUSCULAR | Status: AC | PRN
  Administered 2022-05-26: 100 mL via INTRAVENOUS

## 2022-05-26 MED ORDER — ONDANSETRON 4 MG PO TBDP: 4.0000 mg | ORAL_TABLET | Freq: Four times a day (QID) | ORAL | Status: DC | PRN
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
## 2022-05-26 NOTE — ED Notes (Signed)
Patient transported to CT 
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
## 2022-05-26 NOTE — Progress Notes (Signed)
Orthopedic Tech Progress Note Patient Details:  Tyler Ferguson [birth year] [id number]  Patient ID: Tyler Ferguson, male   DOB: 08/07/1966, 56 y.o.   MRN: [id number] I attended trauma page. Karolee Stamps 05/26/2022, 8:53 PM

## 2022-05-26 NOTE — ED Notes (Signed)
Trauma surgeon at bedside per consultation

## 2022-05-27 ENCOUNTER — Observation Stay (HOSPITAL_COMMUNITY): Payer: Commercial Managed Care - HMO

## 2022-05-27 ENCOUNTER — Other Ambulatory Visit (HOSPITAL_COMMUNITY): Payer: Self-pay

## 2022-05-27 LAB — CBC
HCT: 37.6 % — ABNORMAL LOW (ref 39.0–52.0)
Hemoglobin: 12.2 g/dL — ABNORMAL LOW (ref 13.0–17.0)
MCH: 31.8 pg (ref 26.0–34.0)
MCHC: 32.4 g/dL (ref 30.0–36.0)
MCV: 97.9 fL (ref 80.0–100.0)
Platelets: 211 10*3/uL (ref 150–400)
RBC: 3.84 MIL/uL — ABNORMAL LOW (ref 4.22–5.81)
RDW: 13.2 % (ref 11.5–15.5)
WBC: 8.9 10*3/uL (ref 4.0–10.5)
nRBC: 0 % (ref 0.0–0.2)

## 2022-05-27 LAB — BASIC METABOLIC PANEL
Anion gap: 9 (ref 5–15)
BUN: 12 mg/dL (ref 6–20)
CO2: 23 mmol/L (ref 22–32)
Calcium: 8.3 mg/dL — ABNORMAL LOW (ref 8.9–10.3)
Chloride: 103 mmol/L (ref 98–111)
Creatinine, Ser: 0.72 mg/dL (ref 0.61–1.24)
GFR, Estimated: >60 mL/min (ref >60)
Glucose, Bld: 96 mg/dL (ref 70–99)
Potassium: 4.1 mmol/L (ref 3.5–5.1)
Sodium: 135 mmol/L (ref 135–145)

## 2022-05-27 LAB — URINALYSIS, ROUTINE W REFLEX MICROSCOPIC
Bacteria, UA: NONE SEEN
Bilirubin Urine: NEGATIVE
Glucose, UA: NEGATIVE mg/dL
Ketones, ur: 5 mg/dL — AB
Leukocytes,Ua: NEGATIVE
Nitrite: NEGATIVE
Protein, ur: NEGATIVE mg/dL
Specific Gravity, Urine: 1.013 (ref 1.005–1.030)
pH: 6 (ref 5.0–8.0)

## 2022-05-27 LAB — HIV ANTIBODY (ROUTINE TESTING W REFLEX): HIV Screen 4th Generation wRfx: NONREACTIVE

## 2022-05-27 MED ORDER — ACETAMINOPHEN 500 MG PO TABS
1000.0000 mg | ORAL_TABLET | Freq: Four times a day (QID) | ORAL | Status: DC
  Administered 2022-05-27 (×2): 1000 mg via ORAL
  Filled 2022-05-27 (×2): qty 2

## 2022-05-27 MED ORDER — LORAZEPAM 1 MG PO TABS: 1.0000 mg | ORAL_TABLET | ORAL | Status: DC | PRN

## 2022-05-27 MED ORDER — LIDOCAINE 5 % EX PTCH
1.0000 | MEDICATED_PATCH | CUTANEOUS | 0 refills | Status: DC
  Filled 2022-05-27: qty 30, 30d supply, fill #0

## 2022-05-27 MED ORDER — OXYCODONE HCL 5 MG PO TABS
5.0000 mg | ORAL_TABLET | ORAL | Status: DC | PRN
  Administered 2022-05-27 (×2): 10 mg via ORAL
  Filled 2022-05-27 (×2): qty 2

## 2022-05-27 MED ORDER — IBUPROFEN 800 MG PO TABS
800.0000 mg | ORAL_TABLET | Freq: Three times a day (TID) | ORAL | 0 refills | Status: DC | PRN
Start: 2022-05-27 — End: 2022-07-08
  Filled 2022-05-27: qty 30, 10d supply, fill #0

## 2022-05-27 MED ORDER — METHOCARBAMOL 500 MG PO TABS
1000.0000 mg | ORAL_TABLET | Freq: Three times a day (TID) | ORAL | Status: DC
  Administered 2022-05-27 (×2): 1000 mg via ORAL
  Filled 2022-05-27 (×2): qty 2

## 2022-05-27 MED ORDER — KETOROLAC TROMETHAMINE 30 MG/ML IJ SOLN
30.0000 mg | Freq: Four times a day (QID) | INTRAMUSCULAR | Status: DC | PRN
  Administered 2022-05-27: 30 mg via INTRAVENOUS
  Filled 2022-05-27: qty 1

## 2022-05-27 MED ORDER — THIAMINE HCL 100 MG PO TABS
100.0000 mg | ORAL_TABLET | Freq: Every day | ORAL | Status: DC
  Administered 2022-05-27: 100 mg via ORAL
  Filled 2022-05-27: qty 1

## 2022-05-27 MED ORDER — METHOCARBAMOL 500 MG PO TABS
1000.0000 mg | ORAL_TABLET | Freq: Three times a day (TID) | ORAL | 0 refills | Status: DC | PRN
  Filled 2022-05-27: qty 60, 10d supply, fill #0

## 2022-05-27 MED ORDER — LORAZEPAM 2 MG/ML IJ SOLN: 1.0000 mg | INTRAMUSCULAR | Status: DC | PRN

## 2022-05-27 MED ORDER — LIDOCAINE 5 % EX PTCH
1.0000 | MEDICATED_PATCH | CUTANEOUS | Status: DC
Start: 2022-05-27 — End: 2022-05-27
  Administered 2022-05-27: 1 via TRANSDERMAL
  Filled 2022-05-27: qty 1

## 2022-05-27 MED ORDER — OXYCODONE HCL 5 MG PO TABS
5.0000 mg | ORAL_TABLET | ORAL | 0 refills | Status: DC | PRN
  Filled 2022-05-27: qty 40, 4d supply, fill #0

## 2022-05-27 MED ORDER — THIAMINE HCL 100 MG/ML IJ SOLN: 100.0000 mg | Freq: Every day | INTRAMUSCULAR | Status: DC

## 2022-05-27 MED ORDER — ADULT MULTIVITAMIN W/MINERALS CH
1.0000 | ORAL_TABLET | Freq: Every day | ORAL | Status: DC
  Administered 2022-05-27: 1 via ORAL
  Filled 2022-05-27: qty 1

## 2022-05-27 MED ORDER — FOLIC ACID 1 MG PO TABS
1.0000 mg | ORAL_TABLET | Freq: Every day | ORAL | Status: DC
  Administered 2022-05-27: 1 mg via ORAL
  Filled 2022-05-27: qty 1

## 2022-05-27 MED ORDER — ACETAMINOPHEN 500 MG PO TABS: 1000.0000 mg | ORAL_TABLET | Freq: Four times a day (QID) | ORAL | Status: DC | PRN
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
## 2022-05-27 NOTE — ED Notes (Signed)
Pt bedside commode and cane delivered at bedside. Cleared for dc. Family is coming to pick up pt. Pocket knife returned to pt from security valuables.
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
## 2022-05-27 NOTE — ED Notes (Signed)
Pt TOC meds arrived. Waiting for cane and bedside commode at this time. Pending discharge.

## 2022-05-27 NOTE — ED Notes (Signed)
SW is working on pt cane and bedside commode. Pending dc upon equipment delivery.

## 2022-05-27 NOTE — ED Notes (Signed)
Pt is comfortable at this time. Currently sitting on recliner. Will continue to monitor.

## 2022-05-27 NOTE — ED Notes (Signed)
Pt's pocket knife placed in valuables folder and given to security. Yellow copy provided to pt and pt educated about policy/way for obtaining his pocket knife after he is discharged from floor.

## 2022-05-27 NOTE — ED Notes (Signed)
Lunch order placed

## 2022-05-27 NOTE — ED Notes (Signed)
Ambulated to the recliner from stretcher with PT at bedside. Pt tolerated well. Currently sitting on recliner. Appears comfortable. Pt uses the incentive spirometry regularly and education provided. Will continue to monitor.
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
## 2022-05-27 NOTE — ED Notes (Signed)
PT at bedside.

## 2022-05-27 NOTE — ED Notes (Signed)
Pt was given incentive inspirometer. Pt was able to reach 2000 in value. Will continue to monitor.

## 2022-05-27 NOTE — ED Notes (Signed)
Refused COVID swab at this time.

## 2022-05-27 NOTE — ED Notes (Signed)
Pt continues to refuse covid swab.   
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
## 2022-05-28 ENCOUNTER — Ambulatory Visit: Payer: Commercial Managed Care - HMO

## 2022-05-29 ENCOUNTER — Encounter (HOSPITAL_COMMUNITY): Payer: Self-pay

## 2022-05-29 ENCOUNTER — Emergency Department (HOSPITAL_COMMUNITY): Payer: Commercial Managed Care - HMO

## 2022-05-29 ENCOUNTER — Other Ambulatory Visit: Payer: Self-pay

## 2022-05-29 ENCOUNTER — Emergency Department (HOSPITAL_COMMUNITY)
Admission: EM | Admit: 2022-05-29 | Discharge: 2022-05-29 | Disposition: A | Discharge status: Home / Self Care | Payer: Commercial Managed Care - HMO | Attending: Emergency Medicine | Admitting: Emergency Medicine

## 2022-05-29 ENCOUNTER — Ambulatory Visit (INDEPENDENT_AMBULATORY_CARE_PROVIDER_SITE_OTHER): Payer: Commercial Managed Care - HMO

## 2022-05-29 DIAGNOSIS — R042 Hemoptysis: Secondary | ICD-10-CM

## 2022-05-29 DIAGNOSIS — S299XXD Unspecified injury of thorax, subsequent encounter: Secondary | ICD-10-CM | POA: Diagnosis present

## 2022-05-29 DIAGNOSIS — R600 Localized edema: Secondary | ICD-10-CM | POA: Diagnosis not present

## 2022-05-29 DIAGNOSIS — S2241XD Multiple fractures of ribs, right side, subsequent encounter for fracture with routine healing: Secondary | ICD-10-CM | POA: Diagnosis not present

## 2022-05-29 DIAGNOSIS — S27321D Contusion of lung, unilateral, subsequent encounter: Secondary | ICD-10-CM | POA: Diagnosis not present

## 2022-05-29 DIAGNOSIS — E291 Testicular hypofunction: Secondary | ICD-10-CM | POA: Diagnosis not present

## 2022-05-29 LAB — CBC WITH DIFFERENTIAL/PLATELET
Abs Immature Granulocytes: 0.03 10*3/uL (ref 0.00–0.07)
Basophils Absolute: 0 10*3/uL (ref 0.0–0.1)
Basophils Relative: 0 %
Eosinophils Absolute: 0.3 10*3/uL (ref 0.0–0.5)
Eosinophils Relative: 4 %
HCT: 36.6 % — ABNORMAL LOW (ref 39.0–52.0)
Hemoglobin: 12.3 g/dL — ABNORMAL LOW (ref 13.0–17.0)
Immature Granulocytes: 0 %
Lymphocytes Relative: 19 %
Lymphs Abs: 1.5 10*3/uL (ref 0.7–4.0)
MCH: 32.5 pg (ref 26.0–34.0)
MCHC: 33.6 g/dL (ref 30.0–36.0)
MCV: 96.6 fL (ref 80.0–100.0)
Monocytes Absolute: 0.6 10*3/uL (ref 0.1–1.0)
Monocytes Relative: 7 %
Neutro Abs: 5.5 10*3/uL (ref 1.7–7.7)
Neutrophils Relative %: 70 %
Platelets: 201 10*3/uL (ref 150–400)
RBC: 3.79 MIL/uL — ABNORMAL LOW (ref 4.22–5.81)
RDW: 13.1 % (ref 11.5–15.5)
WBC: 7.8 10*3/uL (ref 4.0–10.5)
nRBC: 0 % (ref 0.0–0.2)

## 2022-05-29 LAB — COMPREHENSIVE METABOLIC PANEL
ALT: 119 U/L — ABNORMAL HIGH (ref 0–44)
AST: 137 U/L — ABNORMAL HIGH (ref 15–41)
Albumin: 3.4 g/dL — ABNORMAL LOW (ref 3.5–5.0)
Alkaline Phosphatase: 47 U/L (ref 38–126)
Anion gap: 7 (ref 5–15)
BUN: 11 mg/dL (ref 6–20)
CO2: 28 mmol/L (ref 22–32)
Calcium: 8.7 mg/dL — ABNORMAL LOW (ref 8.9–10.3)
Chloride: 100 mmol/L (ref 98–111)
Creatinine, Ser: 0.85 mg/dL (ref 0.61–1.24)
GFR, Estimated: >60 mL/min (ref >60)
Glucose, Bld: 118 mg/dL — ABNORMAL HIGH (ref 70–99)
Potassium: 4.1 mmol/L (ref 3.5–5.1)
Sodium: 135 mmol/L (ref 135–145)
Total Bilirubin: 0.8 mg/dL (ref 0.3–1.2)
Total Protein: 6.3 g/dL — ABNORMAL LOW (ref 6.5–8.1)

## 2022-05-29 MED ORDER — TESTOSTERONE CYPIONATE 200 MG/ML IM SOLN
200.0000 mg | INTRAMUSCULAR | Status: DC
  Administered 2022-05-29 – 2022-06-27 (×3): 200 mg via INTRAMUSCULAR

## 2022-05-29 MED ORDER — TESTOSTERONE CYPIONATE 200 MG/ML IM SOLN
200.0000 mg | INTRAMUSCULAR | Status: DC
  Administered 2022-05-29: 200 mg via INTRAMUSCULAR

## 2022-05-29 MED ORDER — FENTANYL CITRATE PF 50 MCG/ML IJ SOSY
50.0000 ug | PREFILLED_SYRINGE | Freq: Once | INTRAMUSCULAR | Status: AC
  Administered 2022-05-29: 50 ug via INTRAVENOUS
  Filled 2022-05-29: qty 1

## 2022-05-29 MED ORDER — IOHEXOL 350 MG/ML SOLN
100.0000 mL | Freq: Once | INTRAVENOUS | Status: AC | PRN
  Administered 2022-05-29: 75 mL via INTRAVENOUS

## 2022-05-29 NOTE — Progress Notes (Signed)
Per orders of Dr. Ethelene Hal, pt is here for testosterone inj, pt received injection IM in the right upper outer quadrant of the buttocks, given by Leonor Liv, CMA '@[id number]'$ . Pt tolerated injection well. Pt was instructed to return in 2 weeks. Sw, cma

## 2022-05-29 NOTE — Progress Notes (Signed)
Tyler Ferguson presented to the ER because he was coughing up blood.  Felt he was finally able to cough some stuff up and it was bloody.  Otherwise he is doing well.  Moving around, using incentive spirometer 10-12 reps per hour, in pain but using pain medication, has to sit up to sleep.  CT completed in ER similar to previous CT with some progression of contusion and slight increase in size of small effusion.  I don't see anything that needs intervened on and the pneumothorax is slightly improved.  He is saturating well on room air.  I think it would be reasonable to discharge the patient from the ER with follow up in trauma clinic.He has good support at home and was given return precautions.  Felicie Morn, [place]
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
## 2022-05-30 ENCOUNTER — Other Ambulatory Visit: Payer: Self-pay | Admitting: Family Medicine

## 2022-05-30 ENCOUNTER — Other Ambulatory Visit (HOSPITAL_COMMUNITY): Payer: Self-pay

## 2022-05-30 MED ORDER — OXYCODONE HCL 5 MG PO TABS
5.0000 mg | ORAL_TABLET | Freq: Four times a day (QID) | ORAL | 0 refills | Status: DC | PRN
Start: 2022-05-30 — End: 2022-06-13
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
## 2022-05-30 NOTE — Telephone Encounter (Signed)
  Encourage patient to contact the pharmacy for refills or they can request refills through [place]:  Please schedule appointment if longer than 1 year  NEXT APPOINTMENT DATE:  MEDICATION:oxyCODONE  Is the patient out of medication?   PHARMACY: [place], [place] - [address] Phone:  [phone]      Let patient know to contact pharmacy at the end of the day to make sure medication is ready.  Please notify patient to allow 48-72 hours to process  CLINICAL FILLS OUT ALL BELOW:   LAST REFILL:  QTY:  REFILL DATE:    OTHER COMMENTS: pt only have 3 pills left   Okay for refill?  Please advise

## 2022-06-03 ENCOUNTER — Encounter: Payer: Self-pay | Admitting: Family Medicine

## 2022-06-03 ENCOUNTER — Ambulatory Visit (INDEPENDENT_AMBULATORY_CARE_PROVIDER_SITE_OTHER): Payer: Commercial Managed Care - HMO | Admitting: Family Medicine

## 2022-06-03 VITALS — BP 128/70 | HR 53 | Temp 97.5°F | Ht 70.0 in | Wt 215.0 lb

## 2022-06-03 DIAGNOSIS — S27321D Contusion of lung, unilateral, subsequent encounter: Secondary | ICD-10-CM

## 2022-06-03 DIAGNOSIS — S2241XD Multiple fractures of ribs, right side, subsequent encounter for fracture with routine healing: Secondary | ICD-10-CM

## 2022-06-03 DIAGNOSIS — S270XXA Traumatic pneumothorax, initial encounter: Secondary | ICD-10-CM | POA: Insufficient documentation

## 2022-06-03 DIAGNOSIS — S27321A Contusion of lung, unilateral, initial encounter: Secondary | ICD-10-CM | POA: Insufficient documentation

## 2022-06-03 DIAGNOSIS — S270XXD Traumatic pneumothorax, subsequent encounter: Secondary | ICD-10-CM | POA: Diagnosis not present

## 2022-06-03 DIAGNOSIS — S2241XA Multiple fractures of ribs, right side, initial encounter for closed fracture: Secondary | ICD-10-CM | POA: Insufficient documentation

## 2022-06-03 MED ORDER — METHOCARBAMOL 500 MG PO TABS: 1000.0000 mg | ORAL_TABLET | Freq: Three times a day (TID) | ORAL | 0 refills | Status: DC | PRN
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
## 2022-06-04 ENCOUNTER — Encounter: Payer: Commercial Managed Care - HMO | Admitting: Gastroenterology

## 2022-06-10 ENCOUNTER — Telehealth: Payer: Self-pay | Admitting: Family Medicine

## 2022-06-10 DIAGNOSIS — S2241XD Multiple fractures of ribs, right side, subsequent encounter for fracture with routine healing: Secondary | ICD-10-CM
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
## 2022-06-12 ENCOUNTER — Ambulatory Visit: Payer: Commercial Managed Care - HMO

## 2022-06-12 NOTE — Telephone Encounter (Signed)
Pt is calling again. He is out of his Hydrocodone  for 3 days and is still in pain.  Please refill.

## 2022-06-13 ENCOUNTER — Encounter (INDEPENDENT_AMBULATORY_CARE_PROVIDER_SITE_OTHER): Payer: Commercial Managed Care - HMO

## 2022-06-13 ENCOUNTER — Encounter: Payer: Self-pay | Admitting: Family Medicine

## 2022-06-13 ENCOUNTER — Ambulatory Visit (INDEPENDENT_AMBULATORY_CARE_PROVIDER_SITE_OTHER): Payer: Commercial Managed Care - HMO | Admitting: Family Medicine

## 2022-06-13 VITALS — BP 138/78 | HR 58 | Temp 97.0°F | Ht 70.0 in | Wt 207.6 lb

## 2022-06-13 DIAGNOSIS — R7989 Other specified abnormal findings of blood chemistry: Secondary | ICD-10-CM

## 2022-06-13 DIAGNOSIS — E291 Testicular hypofunction: Secondary | ICD-10-CM

## 2022-06-13 DIAGNOSIS — Z23 Encounter for immunization: Secondary | ICD-10-CM | POA: Diagnosis not present

## 2022-06-13 DIAGNOSIS — S2241XD Multiple fractures of ribs, right side, subsequent encounter for fracture with routine healing: Secondary | ICD-10-CM

## 2022-06-13 LAB — COMPREHENSIVE METABOLIC PANEL
ALT: 24 U/L (ref 0–53)
AST: 20 U/L (ref 0–37)
Albumin: 4 g/dL (ref 3.5–5.2)
Alkaline Phosphatase: 129 U/L — ABNORMAL HIGH (ref 39–117)
BUN: 19 mg/dL (ref 6–23)
CO2: 29 mEq/L (ref 19–32)
Calcium: 9.4 mg/dL (ref 8.4–10.5)
Chloride: 102 mEq/L (ref 96–112)
Creatinine, Ser: 0.75 mg/dL (ref 0.40–1.50)
GFR: 99.61 mL/min (ref >60.00)
Glucose, Bld: 104 mg/dL — ABNORMAL HIGH (ref 70–99)
Potassium: 4.2 mEq/L (ref 3.5–5.1)
Sodium: 137 mEq/L (ref 135–145)
Total Bilirubin: 0.4 mg/dL (ref 0.2–1.2)
Total Protein: 6.7 g/dL (ref 6.0–8.3)

## 2022-06-13 LAB — CBC
HCT: 41.3 % (ref 39.0–52.0)
Hemoglobin: 13.8 g/dL (ref 13.0–17.0)
MCHC: 33.5 g/dL (ref 30.0–36.0)
MCV: 95.2 fl (ref 78.0–100.0)
Platelets: 451 10*3/uL — ABNORMAL HIGH (ref 150.0–400.0)
RBC: 4.34 Mil/uL (ref 4.22–5.81)
RDW: 13.8 % (ref 11.5–15.5)
WBC: 8.7 10*3/uL (ref 4.0–10.5)

## 2022-06-13 MED ORDER — HYDROCODONE-ACETAMINOPHEN 5-325 MG PO TABS: ORAL_TABLET | ORAL | 0 refills | Status: DC

## 2022-06-13 NOTE — Telephone Encounter (Signed)
Pt has appt today at 8:40. Will advise pt
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
## 2022-06-13 NOTE — Progress Notes (Deleted)
Per orders of *** pt is here for Testosterone Injection. Pt received Testosterone injection in *** at ***. Given by [place] L. CMA/CPT,  Pt tolerated Testosterone Injection well. Pt will return in *** for next Testosterone Injection.

## 2022-06-17 ENCOUNTER — Telehealth: Payer: Self-pay | Admitting: Family Medicine

## 2022-06-17 DIAGNOSIS — S2241XD Multiple fractures of ribs, right side, subsequent encounter for fracture with routine healing: Secondary | ICD-10-CM

## 2022-06-17 MED ORDER — METHOCARBAMOL 500 MG PO TABS: 1000.0000 mg | ORAL_TABLET | Freq: Three times a day (TID) | ORAL | 0 refills | Status: DC | PRN

## 2022-06-17 NOTE — Telephone Encounter (Signed)
Caller Name: pt  Call back phone #: [phone]  MEDICATION(S): methocarbamol (ROBAXIN) 500 MG tablet [[id number]]    Days of Med Remaining: he took his last pill yesterday  Has the patient contacted their pharmacy (YES/NO)?  Yes contact your PCP   Preferred Pharmacy: [place], [place]  [address] [place] [address]  Phone:  [phone]  Fax:  [phone]

## 2022-06-27 ENCOUNTER — Ambulatory Visit (INDEPENDENT_AMBULATORY_CARE_PROVIDER_SITE_OTHER): Payer: Commercial Managed Care - HMO

## 2022-06-27 DIAGNOSIS — E291 Testicular hypofunction: Secondary | ICD-10-CM | POA: Diagnosis not present

## 2022-06-27 NOTE — Progress Notes (Signed)
Pt tolerated well

## 2022-06-28 ENCOUNTER — Telehealth: Payer: Self-pay | Admitting: Family Medicine

## 2022-06-28 DIAGNOSIS — S2241XD Multiple fractures of ribs, right side, subsequent encounter for fracture with routine healing: Secondary | ICD-10-CM

## 2022-06-28 MED ORDER — HYDROCODONE-ACETAMINOPHEN 5-325 MG PO TABS: ORAL_TABLET | ORAL | 0 refills | Status: DC

## 2022-06-28 NOTE — Telephone Encounter (Signed)
Refill request for pending Rx last OV and refill 06/13/22 patient has 1 pill left, please advise.

## 2022-06-28 NOTE — Telephone Encounter (Signed)
Caller Name: Choice Kleinsasser Call back phone #: [phone]  Reason for Call: Pt called in for an update on prescription for hydrocodone. He how 0 pills left and the pharmacy said that it has not been called in yet by [place]. He asked for [place] to please give him a call back.

## 2022-06-28 NOTE — Telephone Encounter (Signed)
  Encourage patient to contact the pharmacy for refills or they can request refills through [place]: 7/31  MEDICATION:hydrocodone  Is the patient out of medication? 1 pill left  PHARMACY: [facility] on [address]  Let patient know to contact pharmacy at the end of the day to make sure medication is ready.  Please notify patient to allow 48-72 hours to process  CLINICAL FILLS OUT ALL BELOW:   LAST REFILL:  QTY:  REFILL DATE:    OTHER COMMENTS: Pt would like this refilled before the weekend.   Okay for refill?  Please advise

## 2022-06-28 NOTE — Telephone Encounter (Signed)
Rx sent in

## 2022-07-04 ENCOUNTER — Telehealth: Payer: Self-pay | Admitting: Family Medicine

## 2022-07-04 DIAGNOSIS — N5239 Other post-surgical erectile dysfunction: Secondary | ICD-10-CM

## 2022-07-04 MED ORDER — SILDENAFIL CITRATE 100 MG PO TABS: 50.0000 mg | ORAL_TABLET | Freq: Every day | ORAL | 5 refills | Status: DC | PRN

## 2022-07-04 NOTE — Telephone Encounter (Signed)
Caller Name: pt Call back phone #: [phone]  MEDICATION(S): sildenafil (VIAGRA) 100 MG tablet [[id number]]   Days of Med Remaining: none  Has the patient contacted their pharmacy (YES/NO)? Yes the script he thought he had, has expired.  What did pharmacy advise? Contact your pcp  Preferred Pharmacy: [facility] [address] [place], [place]  [address] [place] [address]  Phone:  [phone]  Fax:  [phone]   ~~~Please advise patient/caregiver to allow 2-3 business days to process RX refills.

## 2022-07-08 ENCOUNTER — Encounter: Payer: Self-pay | Admitting: Family Medicine

## 2022-07-08 ENCOUNTER — Ambulatory Visit (INDEPENDENT_AMBULATORY_CARE_PROVIDER_SITE_OTHER): Payer: Commercial Managed Care - HMO | Admitting: Family Medicine

## 2022-07-08 ENCOUNTER — Ambulatory Visit (INDEPENDENT_AMBULATORY_CARE_PROVIDER_SITE_OTHER): Payer: Commercial Managed Care - HMO

## 2022-07-08 VITALS — BP 130/78 | HR 87 | Temp 97.5°F | Ht 70.0 in | Wt 205.4 lb

## 2022-07-08 DIAGNOSIS — S27321D Contusion of lung, unilateral, subsequent encounter: Secondary | ICD-10-CM

## 2022-07-08 DIAGNOSIS — S86911D Strain of unspecified muscle(s) and tendon(s) at lower leg level, right leg, subsequent encounter: Secondary | ICD-10-CM

## 2022-07-08 DIAGNOSIS — M5412 Radiculopathy, cervical region: Secondary | ICD-10-CM

## 2022-07-08 DIAGNOSIS — S2241XD Multiple fractures of ribs, right side, subsequent encounter for fracture with routine healing: Secondary | ICD-10-CM

## 2022-07-08 DIAGNOSIS — M5410 Radiculopathy, site unspecified: Secondary | ICD-10-CM | POA: Insufficient documentation
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
## 2022-07-10 ENCOUNTER — Telehealth: Payer: Self-pay | Admitting: Family Medicine

## 2022-07-10 NOTE — Telephone Encounter (Signed)
Pt is wanting a call back concerning his most recent chest x-ray. Please advise pt @ [phone]

## 2022-07-10 NOTE — Telephone Encounter (Signed)
Patient calling to go over xray results. Please advise

## 2022-07-11 ENCOUNTER — Ambulatory Visit: Payer: Commercial Managed Care - HMO

## 2022-07-11 NOTE — Telephone Encounter (Signed)
Patient aware collapsed lung have resolved, no other questions.

## 2022-07-12 ENCOUNTER — Ambulatory Visit (INDEPENDENT_AMBULATORY_CARE_PROVIDER_SITE_OTHER): Payer: Commercial Managed Care - HMO

## 2022-07-12 DIAGNOSIS — E291 Testicular hypofunction: Secondary | ICD-10-CM | POA: Diagnosis not present

## 2022-07-12 MED ORDER — TESTOSTERONE CYPIONATE 200 MG/ML IM SOLN
200.0000 mg | INTRAMUSCULAR | Status: DC
  Administered 2022-07-12: 200 mg via INTRAMUSCULAR

## 2022-07-12 NOTE — Progress Notes (Signed)
Per orders of Dr Ethelene Hal, injection of testosterone given in LT buttocks by Armandina Gemma, cma.  Patient tolerated injection well.   Return in 2 weeks.  Dm/cma

## 2022-07-25 ENCOUNTER — Ambulatory Visit: Payer: Commercial Managed Care - HMO

## 2022-07-30 ENCOUNTER — Ambulatory Visit (INDEPENDENT_AMBULATORY_CARE_PROVIDER_SITE_OTHER): Payer: Commercial Managed Care - HMO

## 2022-07-30 DIAGNOSIS — E291 Testicular hypofunction: Secondary | ICD-10-CM

## 2022-07-30 MED ORDER — TESTOSTERONE CYPIONATE 200 MG/ML IM SOLN
200.0000 mg | INTRAMUSCULAR | Status: DC
  Administered 2022-07-30 – 2022-08-28 (×3): 200 mg via INTRAMUSCULAR

## 2022-07-30 NOTE — Progress Notes (Signed)
Per orders of Dr. Ethelene Hal, pt is here for testosterone inj, pt received injection IM in the RT upper outer quadrant of the buttocks, given by Leonor Liv, CMA. Pt tolerated injection well. Pt notified to report any adverse reactions to me immediately.

## 2022-08-05 NOTE — Telephone Encounter (Signed)
Na

## 2022-08-14 ENCOUNTER — Other Ambulatory Visit: Payer: Self-pay | Admitting: Family Medicine

## 2022-08-14 ENCOUNTER — Ambulatory Visit: Payer: Commercial Managed Care - HMO

## 2022-08-14 DIAGNOSIS — E291 Testicular hypofunction: Secondary | ICD-10-CM

## 2022-08-15 ENCOUNTER — Ambulatory Visit (INDEPENDENT_AMBULATORY_CARE_PROVIDER_SITE_OTHER): Payer: Commercial Managed Care - HMO

## 2022-08-15 ENCOUNTER — Other Ambulatory Visit: Payer: Self-pay

## 2022-08-15 DIAGNOSIS — E291 Testicular hypofunction: Secondary | ICD-10-CM

## 2022-08-15 NOTE — Progress Notes (Cosign Needed)
                                       +                                                                                                                                                                                                                                                                              + +                                                                                                                                                                                                                                                                                                                                                                                                                                                                                                                                                                        ..  After obtaining consent, and per orders of Dr. Ethelene Hal, injection of Depotestosterone injection given by Augustina Mood. Patient instructed to remain in clinic for 20 minutes afterwards, and to report any adverse  reaction to me immediately.

## 2022-08-20 ENCOUNTER — Other Ambulatory Visit: Payer: Self-pay

## 2022-08-22 ENCOUNTER — Other Ambulatory Visit (INDEPENDENT_AMBULATORY_CARE_PROVIDER_SITE_OTHER): Payer: Commercial Managed Care - HMO

## 2022-08-22 DIAGNOSIS — E291 Testicular hypofunction: Secondary | ICD-10-CM

## 2022-08-22 NOTE — Addendum Note (Signed)
Addended by: Jon Billings on: 08/22/2022 07:51 AM   Modules accepted: Orders

## 2022-08-26 ENCOUNTER — Telehealth: Payer: Self-pay | Admitting: Family Medicine

## 2022-08-26 NOTE — Telephone Encounter (Signed)
Patient calling to go over lab results, informed patient that results no in as of now but we will give him a call as soon as they come in .

## 2022-08-26 NOTE — Telephone Encounter (Signed)
Pt is wanting a cb concerning his most recent lab Testosterone level when ever ready. Please advise pt @ [phone]

## 2022-08-28 ENCOUNTER — Ambulatory Visit (INDEPENDENT_AMBULATORY_CARE_PROVIDER_SITE_OTHER): Payer: Commercial Managed Care - HMO

## 2022-08-28 DIAGNOSIS — E291 Testicular hypofunction: Secondary | ICD-10-CM | POA: Diagnosis not present

## 2022-08-28 LAB — TESTOSTERONE, FREE & TOTAL
Free Testosterone: 193.8 pg/mL — ABNORMAL HIGH (ref 35.0–155.0)
Testosterone, Total, LC-MS-MS: 1145 ng/dL — ABNORMAL HIGH (ref 250–1100)

## 2022-08-28 NOTE — Progress Notes (Signed)
..  After obtaining consent, and per orders of Dr. Ethelene Hal, injection of Testosterone Injection given by Augustina Mood. Patient instructed to remain in clinic for 20 minutes afterwards, and to report any adverse reaction to me immediately.

## 2022-08-29 ENCOUNTER — Ambulatory Visit: Payer: Commercial Managed Care - HMO

## 2022-09-02 ENCOUNTER — Ambulatory Visit: Payer: Commercial Managed Care - HMO | Admitting: Family Medicine

## 2022-09-13 ENCOUNTER — Encounter: Payer: Self-pay | Admitting: Family Medicine

## 2022-09-13 ENCOUNTER — Telehealth: Payer: Self-pay | Admitting: Family Medicine

## 2022-09-13 ENCOUNTER — Ambulatory Visit (INDEPENDENT_AMBULATORY_CARE_PROVIDER_SITE_OTHER): Payer: Commercial Managed Care - HMO | Admitting: Family Medicine

## 2022-09-13 VITALS — BP 136/74 | HR 66 | Temp 98.2°F | Ht 70.0 in | Wt 207.8 lb

## 2022-09-13 DIAGNOSIS — Z0000 Encounter for general adult medical examination without abnormal findings: Secondary | ICD-10-CM

## 2022-09-13 DIAGNOSIS — M25561 Pain in right knee: Secondary | ICD-10-CM

## 2022-09-13 DIAGNOSIS — G5621 Lesion of ulnar nerve, right upper limb: Secondary | ICD-10-CM | POA: Diagnosis not present

## 2022-09-13 DIAGNOSIS — E291 Testicular hypofunction: Secondary | ICD-10-CM | POA: Diagnosis not present

## 2022-09-13 MED ORDER — TESTOSTERONE CYPIONATE 200 MG/ML IM SOLN: 150.0000 mg | INTRAMUSCULAR | Status: DC

## 2022-09-13 MED ORDER — TESTOSTERONE CYPIONATE 100 MG/ML IM SOLN
150.0000 mg | INTRAMUSCULAR | Status: DC
  Administered 2022-09-13: 150 mg via INTRAMUSCULAR

## 2022-09-13 MED ORDER — TESTOSTERONE CYPIONATE 150 MG/ML IJ SOLN: INTRAMUSCULAR | 1 refills | Status: DC

## 2022-09-13 NOTE — Addendum Note (Signed)
Addended by: Lynda Rainwater on: 09/13/2022 10:56 AM   Modules accepted: Orders
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
## 2022-09-13 NOTE — Telephone Encounter (Signed)
Caller Name: [facility] phone #: [phone]  Reason for Call: Pharmacy does not have the 150 testosterone in stock. Dr. Ethelene Hal normally does 200, was this meant to be changed?

## 2022-09-13 NOTE — Telephone Encounter (Signed)
Please advise message below pharmacy states that they do not have requested dose amount.

## 2022-09-16 ENCOUNTER — Ambulatory Visit: Payer: Commercial Managed Care - HMO | Admitting: Surgery

## 2022-09-16 ENCOUNTER — Encounter: Payer: Self-pay | Admitting: Surgery

## 2022-09-16 VITALS — BP 125/77 | HR 59 | Ht 70.0 in | Wt 208.0 lb

## 2022-09-16 DIAGNOSIS — M4722 Other spondylosis with radiculopathy, cervical region: Secondary | ICD-10-CM

## 2022-09-16 DIAGNOSIS — G5621 Lesion of ulnar nerve, right upper limb: Secondary | ICD-10-CM

## 2022-09-16 DIAGNOSIS — M25561 Pain in right knee: Secondary | ICD-10-CM | POA: Diagnosis not present
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
## 2022-09-17 ENCOUNTER — Telehealth: Payer: Self-pay | Admitting: Orthopaedic Surgery

## 2022-09-17 MED ORDER — TESTOSTERONE CYPIONATE 100 MG/ML IJ SOLN: INTRAMUSCULAR | 1 refills | Status: DC

## 2022-09-17 MED ORDER — METHYLPREDNISOLONE 4 MG PO TABS: ORAL_TABLET | ORAL | 0 refills | Status: DC

## 2022-09-17 MED ORDER — TESTOSTERONE CYPIONATE 100 MG/ML IM SOLN
150.0000 mg | INTRAMUSCULAR | Status: DC
  Administered 2022-09-25 (×2): 150 mg via INTRAMUSCULAR
  Administered 2022-12-10: 100 mg via INTRAMUSCULAR

## 2022-09-17 NOTE — Telephone Encounter (Signed)
Pt called stating he was seen yesterday and Dr Lorin Mercy stated to send prednisone in for him. Please call pt about this matter at [phone]. Please send to pharmacy on file.

## 2022-09-17 NOTE — Telephone Encounter (Signed)
Spoke with pharmacist who states that the requested does of '150mg'$  they do not carry this only comes in '100mg'$  or '200mg'$ . Please advise.

## 2022-09-17 NOTE — Addendum Note (Signed)
Addended by: Jon Billings on: 09/17/2022 11:56 AM   Modules accepted: Orders

## 2022-09-17 NOTE — Telephone Encounter (Signed)
Sent to pharmacy. I called patient and advised. 

## 2022-09-18 NOTE — Telephone Encounter (Signed)
Rx changed to '100mg'$  and sent to pharmacy

## 2022-09-25 ENCOUNTER — Ambulatory Visit (INDEPENDENT_AMBULATORY_CARE_PROVIDER_SITE_OTHER): Payer: Commercial Managed Care - HMO

## 2022-09-25 DIAGNOSIS — E291 Testicular hypofunction: Secondary | ICD-10-CM | POA: Diagnosis not present

## 2022-09-25 NOTE — Progress Notes (Signed)
..  After obtaining consent, and per orders of Dr. Ethelene Hal, injection of Testosterone Cypionate given by Augustina Mood. Patient tolerated injection well instructed to remain in clinic for 20 minutes afterwards, and to report any adverse reaction to me immediately.

## 2022-10-09 ENCOUNTER — Ambulatory Visit: Payer: Commercial Managed Care - HMO | Admitting: Orthopaedic Surgery

## 2022-10-10 ENCOUNTER — Ambulatory Visit: Payer: Commercial Managed Care - HMO | Admitting: Physical Therapy

## 2022-10-10 ENCOUNTER — Ambulatory Visit: Payer: Commercial Managed Care - HMO

## 2022-10-16 ENCOUNTER — Ambulatory Visit: Payer: Commercial Managed Care - HMO | Admitting: Orthopaedic Surgery
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
## 2022-10-17 ENCOUNTER — Ambulatory Visit: Payer: Commercial Managed Care - HMO

## 2022-10-24 ENCOUNTER — Ambulatory Visit (INDEPENDENT_AMBULATORY_CARE_PROVIDER_SITE_OTHER): Payer: Commercial Managed Care - HMO

## 2022-10-24 DIAGNOSIS — E291 Testicular hypofunction: Secondary | ICD-10-CM

## 2022-10-24 MED ORDER — TESTOSTERONE CYPIONATE 200 MG/ML IM SOLN
200.0000 mg | Freq: Once | INTRAMUSCULAR | Status: AC
  Administered 2022-10-24: 200 mg via INTRAMUSCULAR

## 2022-10-24 NOTE — Addendum Note (Signed)
Addended by: Beryle Lathe S on: 10/24/2022 10:46 AM   Modules accepted: Orders

## 2022-10-24 NOTE — Addendum Note (Signed)
Addended by: Augustina Mood on: 10/24/2022 10:45 AM   Modules accepted: Orders

## 2022-10-24 NOTE — Progress Notes (Addendum)
..  After obtaining consent, and per orders of Dr. Ethelene Hal, injection of Testosterone given by Augustina Mood. Patient tolerated injection well instructed to remain in clinic for 20 minutes afterwards, and to report any adverse reaction to me immediately.     Original order was discontinued due to [facility] code not matching in system. New order for testosterone was entered to ensure pt is correctly charged.

## 2022-11-05 ENCOUNTER — Ambulatory Visit (INDEPENDENT_AMBULATORY_CARE_PROVIDER_SITE_OTHER): Payer: Commercial Managed Care - HMO

## 2022-11-05 DIAGNOSIS — E291 Testicular hypofunction: Secondary | ICD-10-CM

## 2022-11-05 MED ORDER — TESTOSTERONE CYPIONATE 200 MG/ML IM SOLN
200.0000 mg | Freq: Once | INTRAMUSCULAR | Status: AC
  Administered 2022-11-05: 200 mg via INTRAMUSCULAR

## 2022-12-06 ENCOUNTER — Encounter: Payer: Self-pay | Admitting: Family Medicine

## 2022-12-06 ENCOUNTER — Ambulatory Visit (INDEPENDENT_AMBULATORY_CARE_PROVIDER_SITE_OTHER): Payer: Commercial Managed Care - HMO | Admitting: Family Medicine

## 2022-12-06 VITALS — BP 128/82 | HR 57 | Temp 97.6°F | Wt 201.4 lb

## 2022-12-06 DIAGNOSIS — L989 Disorder of the skin and subcutaneous tissue, unspecified: Secondary | ICD-10-CM | POA: Diagnosis not present

## 2022-12-06 NOTE — Patient Instructions (Signed)
For the skin lesion, we are referring to dermatology have excised.
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
## 2022-12-10 ENCOUNTER — Ambulatory Visit (INDEPENDENT_AMBULATORY_CARE_PROVIDER_SITE_OTHER): Payer: Commercial Managed Care - HMO

## 2022-12-10 DIAGNOSIS — E291 Testicular hypofunction: Secondary | ICD-10-CM

## 2022-12-10 NOTE — Progress Notes (Addendum)
Patient presents for Testosterone cypionate injection per the orders of Dr. Ethelene Hal. Patient reports that he now receives 1 mL of a solution of '100mg'$  per ml. Confirmed amount with Dr. Ethelene Hal from telephone encounter from 09/13/2022. Injection placed in Left outer ventrogluteal region. Patient tolerated procedure well with no concerns.

## 2022-12-24 ENCOUNTER — Ambulatory Visit (INDEPENDENT_AMBULATORY_CARE_PROVIDER_SITE_OTHER): Payer: Commercial Managed Care - HMO

## 2022-12-24 DIAGNOSIS — E291 Testicular hypofunction: Secondary | ICD-10-CM | POA: Diagnosis not present

## 2022-12-24 MED ORDER — TESTOSTERONE CYPIONATE 100 MG/ML IM SOLN
100.0000 mg | INTRAMUSCULAR | Status: DC
  Administered 2022-12-24 – 2023-01-22 (×2): 100 mg via INTRAMUSCULAR

## 2022-12-24 NOTE — Progress Notes (Signed)
After obtaining consent, and per orders of Dr. Ethelene Hal, injection of testosterone given by Lynda Rainwater. Patient instructed to remain in clinic for 20 minutes afterwards, and to report any adverse reaction to me immediately.

## 2023-01-22 ENCOUNTER — Ambulatory Visit (INDEPENDENT_AMBULATORY_CARE_PROVIDER_SITE_OTHER): Payer: Commercial Managed Care - HMO

## 2023-01-22 DIAGNOSIS — E291 Testicular hypofunction: Secondary | ICD-10-CM | POA: Diagnosis not present

## 2023-01-22 NOTE — Progress Notes (Signed)
Patient presents for Testosterone cypionate injection. Injection placed in right outer ventrogluteal region region. Patient tolerated procedure well with no concerns.

## 2023-01-28 ENCOUNTER — Ambulatory Visit (INDEPENDENT_AMBULATORY_CARE_PROVIDER_SITE_OTHER): Payer: Commercial Managed Care - HMO | Admitting: Family Medicine

## 2023-01-28 ENCOUNTER — Encounter: Payer: Self-pay | Admitting: Family Medicine

## 2023-01-28 VITALS — Temp 98.1°F | Ht 70.0 in | Wt 203.4 lb

## 2023-01-28 DIAGNOSIS — M25461 Effusion, right knee: Secondary | ICD-10-CM | POA: Insufficient documentation

## 2023-01-28 DIAGNOSIS — M25469 Effusion, unspecified knee: Secondary | ICD-10-CM | POA: Insufficient documentation

## 2023-01-28 DIAGNOSIS — M7989 Other specified soft tissue disorders: Secondary | ICD-10-CM
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
## 2023-01-28 NOTE — Assessment & Plan Note (Signed)
There is an acute effusion with redness and increased warmth of the right knee. I would be concerned for possible joint infection. I recommend he go to the [facility] clinic today to pursue having this effusion aspirated and tested for possible infection.

## 2023-01-28 NOTE — Patient Instructions (Signed)
Recommend being seen urgently by [facility] at their Urgent Care clinic

## 2023-01-29 ENCOUNTER — Telehealth: Payer: Self-pay | Admitting: Family Medicine

## 2023-01-29 DIAGNOSIS — M25461 Effusion, right knee: Secondary | ICD-10-CM

## 2023-01-29 DIAGNOSIS — M7989 Other specified soft tissue disorders: Secondary | ICD-10-CM

## 2023-01-29 NOTE — Telephone Encounter (Signed)
Thank you. Dm/cma

## 2023-01-29 NOTE — Telephone Encounter (Signed)
Pt made the appt with [facility] but found they are not in network, Please recommend another office. He wanted me to remind that DR Gena Fray wanted it urgently.

## 2023-01-30 ENCOUNTER — Ambulatory Visit (HOSPITAL_COMMUNITY)
Admission: RE | Admit: 2023-01-30 | Discharge: 2023-01-30 | Disposition: A | Discharge status: Home / Self Care | Payer: Commercial Managed Care - HMO | Source: Ambulatory Visit | Attending: Family Medicine | Admitting: Family Medicine

## 2023-01-30 ENCOUNTER — Telehealth: Payer: Self-pay

## 2023-01-30 DIAGNOSIS — M7989 Other specified soft tissue disorders: Secondary | ICD-10-CM | POA: Diagnosis present

## 2023-01-30 NOTE — Telephone Encounter (Signed)
[facility] called to inform you that this patient is negative in his RLE.

## 2023-01-31 ENCOUNTER — Encounter: Payer: Self-pay | Admitting: Orthopaedic Surgery

## 2023-01-31 ENCOUNTER — Ambulatory Visit: Payer: Commercial Managed Care - HMO | Admitting: Orthopaedic Surgery

## 2023-01-31 ENCOUNTER — Ambulatory Visit (INDEPENDENT_AMBULATORY_CARE_PROVIDER_SITE_OTHER): Payer: Commercial Managed Care - HMO

## 2023-01-31 VITALS — Ht 70.0 in | Wt 203.0 lb

## 2023-01-31 DIAGNOSIS — G8929 Other chronic pain: Secondary | ICD-10-CM

## 2023-01-31 DIAGNOSIS — M25561 Pain in right knee: Secondary | ICD-10-CM
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
## 2023-02-25 ENCOUNTER — Ambulatory Visit (INDEPENDENT_AMBULATORY_CARE_PROVIDER_SITE_OTHER): Payer: Commercial Managed Care - HMO

## 2023-02-25 ENCOUNTER — Other Ambulatory Visit: Payer: Self-pay | Admitting: Family Medicine

## 2023-02-25 ENCOUNTER — Other Ambulatory Visit: Payer: Self-pay

## 2023-02-25 DIAGNOSIS — E291 Testicular hypofunction: Secondary | ICD-10-CM

## 2023-02-25 MED ORDER — TESTOSTERONE CYPIONATE 100 MG/ML IM SOLN: INTRAMUSCULAR | 0 refills | Status: DC

## 2023-02-25 MED ORDER — TESTOSTERONE CYPIONATE 200 MG/ML IM SOLN
100.0000 mg | Freq: Once | INTRAMUSCULAR | Status: AC
  Administered 2023-02-25: 100 mg via INTRAMUSCULAR

## 2023-02-25 NOTE — Progress Notes (Signed)
After obtaining consent, and per orders of Dr. Ethelene Hal, injection of Testosterone 100 ml given IM/  Right ventrogluteal   by Lynnea Maizes. Patient instructed to remain in clinic for 20 minutes afterwards, and to report any adverse reaction to me immediately.

## 2023-04-15 ENCOUNTER — Other Ambulatory Visit: Payer: Self-pay | Admitting: Family Medicine

## 2023-04-15 DIAGNOSIS — N5239 Other post-surgical erectile dysfunction: Secondary | ICD-10-CM

## 2023-05-21 ENCOUNTER — Other Ambulatory Visit: Payer: Self-pay | Admitting: Family Medicine

## 2023-05-21 DIAGNOSIS — E291 Testicular hypofunction: Secondary | ICD-10-CM

## 2023-05-21 NOTE — Telephone Encounter (Signed)
Refill request for pending Rx last refill 02/25/23 last OV 01/28/23. Please advise

## 2023-05-21 NOTE — Telephone Encounter (Signed)
Pt needs Testosterone Cypionate 100 MG/ML SOLN [[id number]] refilled. He is also going to call the pharmacy to see if that moves it along.

## 2023-05-22 ENCOUNTER — Encounter: Payer: Self-pay | Admitting: Family Medicine

## 2023-05-22 ENCOUNTER — Ambulatory Visit (INDEPENDENT_AMBULATORY_CARE_PROVIDER_SITE_OTHER): Payer: Commercial Managed Care - HMO | Admitting: Family Medicine

## 2023-05-22 VITALS — BP 132/80 | HR 59 | Temp 98.1°F | Ht 70.0 in | Wt 203.0 lb

## 2023-05-22 DIAGNOSIS — M5410 Radiculopathy, site unspecified: Secondary | ICD-10-CM

## 2023-05-22 MED ORDER — TESTOSTERONE CYPIONATE 100 MG/ML IM SOLN: INTRAMUSCULAR | 0 refills | Status: DC

## 2023-05-22 MED ORDER — PREDNISONE 10 MG PO TABS: ORAL_TABLET | ORAL | 0 refills | Status: AC

## 2023-05-22 MED ORDER — GABAPENTIN 100 MG PO CAPS: ORAL_CAPSULE | ORAL | 3 refills | Status: DC

## 2023-05-22 NOTE — Progress Notes (Signed)
Established Patient Office Visit   Subjective:  Patient ID: Tyler Ferguson, male    DOB: [birth year]  Age: 57 y.o. MRN: [id number]  Chief Complaint  Patient presents with   Back Pain    Sharp back pain shock feeling that radiates down leg and all over back. Right knee pains that come and go.     Back Pain Associated symptoms include tingling. Pertinent negatives include no abdominal pain or weakness.   Encounter Diagnoses  Name Primary?   Radiculopathy, unspecified spinal region Yes   Presents with a 2-day history of an electric pain shooting down both of his legs but predominantly on the right.  This is noticed when he flexes his neck.  He has also noticed the pain when walking with each step.  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  He has ongoing paresthesias in his legs that seem to be worse with the onset of above.   Review of Systems  Constitutional: Negative.   HENT: Negative.    Eyes:  Negative for blurred vision, discharge and redness.  Respiratory: Negative.    Cardiovascular: Negative.   Gastrointestinal:  Negative for abdominal pain.  Genitourinary: Negative.   Musculoskeletal:  Positive for back pain and neck pain. Negative for myalgias.  Skin:  Negative for rash.  Neurological:  Positive for tingling. Negative for loss of consciousness and weakness.  Endo/Heme/Allergies:  Negative for polydipsia.     Current Outpatient Medications:    Alanine POWD, Take 1 Scoop by mouth daily. When Pt goes to the gym, Disp: , Rfl:    Amino Acids (AMINO ACID PO), Take 3 capsules by mouth daily., Disp: , Rfl:    CREATINE MONOHYDRATE PO, Take 1 Scoop by mouth daily. When Pt goes to the gym, Disp: , Rfl:    gabapentin (NEURONTIN) 100 MG capsule, Take 1 capsule (100 mg total) by mouth at bedtime for 2 days, THEN 1 capsule (100 mg total) 2 (two) times daily for 2 days, THEN 1 capsule (100 mg total)  3 (three) times daily., Disp: 95 capsule, Rfl: 3   meloxicam (MOBIC) 15 MG tablet, Take 15 mg by mouth daily., Disp: , Rfl:    Multiple Vitamin (MULTIVITAMIN) capsule, Take 1 capsule by mouth daily., Disp: , Rfl:    NON FORMULARY, Take 4 capsules by mouth See admin instructions. HMB/Vit D3 Take 4 capsules by mouth 2-3 times a day, Disp: , Rfl:    Omega-3 1000 MG CAPS, Take 1,000 mg by mouth daily., Disp: , Rfl:    predniSONE (DELTASONE) 10 MG tablet, Take 3 tablets (30 mg total) by mouth 2 (two) times daily with a meal for 3 days, THEN 2 tablets (20 mg total) 2 (two) times daily with a meal for 3 days, THEN 1 tablet (10 mg total) 2 (two) times daily with a meal for 3 days, THEN 1 tablet (10 mg total) daily with breakfast for 3 days., Disp: 39 tablet, Rfl: 0   sildenafil (VIAGRA) 100 MG tablet, TAKE 1/2 TO 1 (ONE-HALF TO ONE) TABLET BY MOUTH ONCE DAILY AS NEEDED FOR ERECTILE DYSFUNCTION, Disp: 20 tablet, Rfl: 4   testosterone cypionate (DEPOTESTOTERONE CYPIONATE) 100 MG/ML injection, INJECT 1 & 1/2 (ONE & ONE HALF) ML INTRAMUSCULARLY EVERY TWO WEEKS, Disp: 10 mL, Rfl: 0   Testosterone Cypionate 100 MG/ML SOLN, Inject 1 1/2 cc into muscle every 14 days., Disp: 20 mL, Rfl: 1  Current Facility-Administered Medications:  testosterone cypionate (DEPOTESTOSTERONE CYPIONATE) injection 150 mg, 150 mg, Intramuscular, Q14 Days, Mliss Sax, MD   testosterone cypionate (DEPOTESTOTERONE CYPIONATE) injection 100 mg, 100 mg, Intramuscular, Q14 Days, Mliss Sax, MD, 100 mg at 01/22/23 1525   testosterone cypionate (DEPOTESTOTERONE CYPIONATE) injection 150 mg, 150 mg, Intramuscular, Q14 Days, Mliss Sax, MD, 100 mg at 12/10/22 1509   Objective:     BP 132/80 (BP Location: Right Arm, Patient Position: Sitting, Cuff Size: Normal)   Pulse (!) 59   Temp 98.1 F (36.7 C) (Temporal)   Ht 5\' 10"  (1.778 m)   Wt 203 lb (92.1 kg)   SpO2 97%   BMI 29.13 kg/m    Physical  Exam Constitutional:      General: He is not in acute distress.    Appearance: Normal appearance. He is not ill-appearing, toxic-appearing or diaphoretic.  HENT:     Head: Normocephalic and atraumatic.     Right Ear: External ear normal.     Left Ear: External ear normal.  Eyes:     General: No scleral icterus.       Right eye: No discharge.        Left eye: No discharge.     Extraocular Movements: Extraocular movements intact.     Conjunctiva/sclera: Conjunctivae normal.  Pulmonary:     Effort: Pulmonary effort is normal. No respiratory distress.  Musculoskeletal:     Cervical back: No bony tenderness. No pain with movement. Normal range of motion.     Thoracic back: No bony tenderness. Normal range of motion.     Lumbar back: No bony tenderness. Normal range of motion. Negative right straight leg raise test and negative left straight leg raise test.     Comments: Negative Spurling's to right and left  Calf and thigh musculature slightly atrophic.  Strength was intact.  Skin:    General: Skin is warm and dry.  Neurological:     Mental Status: He is alert and oriented to person, place, and time.     Motor: No weakness.     Deep Tendon Reflexes:     Reflex Scores:      Patellar reflexes are 2+ on the right side and 2+ on the left side.      Achilles reflexes are 1+ on the right side and 1+ on the left side. Psychiatric:        Mood and Affect: Mood normal.        Behavior: Behavior normal.      No results found for any visits on 05/22/23.    The ASCVD Risk score (Arnett DK, et al., 2019) failed to calculate for the following reasons:   Cannot find a previous HDL lab   Cannot find a previous total cholesterol lab   Unable to determine if patient is Non-Hispanic African American    Assessment & Plan:   Radiculopathy, unspecified spinal region -     Gabapentin; Take 1 capsule (100 mg total) by mouth at bedtime for 2 days, THEN 1 capsule (100 mg total) 2 (two) times  daily for 2 days, THEN 1 capsule (100 mg total) 3 (three) times daily.  Dispense: 95 capsule; Refill: 3 -     predniSONE; Take 3 tablets (30 mg total) by mouth 2 (two) times daily with a meal for 3 days, THEN 2 tablets (20 mg total) 2 (two) times daily with a meal for 3 days, THEN 1 tablet (10 mg total) 2 (two) times daily with  a meal for 3 days, THEN 1 tablet (10 mg total) daily with breakfast for 3 days.  Dispense: 39 tablet; Refill: 0 -     Ambulatory referral to Neurosurgery    Return Please return fasting for blood work ordered.  Back in October..  Radiculopathy.  Not sure as to the origin.  Relatively normal exam.  Mliss Sax, MD

## 2023-05-26 ENCOUNTER — Other Ambulatory Visit (INDEPENDENT_AMBULATORY_CARE_PROVIDER_SITE_OTHER): Payer: Commercial Managed Care - HMO

## 2023-05-26 DIAGNOSIS — Z0000 Encounter for general adult medical examination without abnormal findings: Secondary | ICD-10-CM

## 2023-05-26 DIAGNOSIS — E291 Testicular hypofunction: Secondary | ICD-10-CM

## 2023-05-26 LAB — CBC
HCT: 43.2 % (ref 39.0–52.0)
Hemoglobin: 14.3 g/dL (ref 13.0–17.0)
MCHC: 33.1 g/dL (ref 30.0–36.0)
MCV: 96.1 fl (ref 78.0–100.0)
Platelets: 285 10*3/uL (ref 150.0–400.0)
RBC: 4.5 Mil/uL (ref 4.22–5.81)
RDW: 13.6 % (ref 11.5–15.5)
WBC: 10.2 10*3/uL (ref 4.0–10.5)

## 2023-05-26 LAB — COMPREHENSIVE METABOLIC PANEL
ALT: 24 U/L (ref 0–53)
AST: 19 U/L (ref 0–37)
Albumin: 4 g/dL (ref 3.5–5.2)
Alkaline Phosphatase: 51 U/L (ref 39–117)
BUN: 14 mg/dL (ref 6–23)
CO2: 28 mEq/L (ref 19–32)
Calcium: 9 mg/dL (ref 8.4–10.5)
Chloride: 102 mEq/L (ref 96–112)
Creatinine, Ser: 0.8 mg/dL (ref 0.40–1.50)
GFR: 97.04 mL/min (ref >60.00)
Glucose, Bld: 94 mg/dL (ref 70–99)
Potassium: 4 mEq/L (ref 3.5–5.1)
Sodium: 137 mEq/L (ref 135–145)
Total Bilirubin: 0.4 mg/dL (ref 0.2–1.2)
Total Protein: 6.8 g/dL (ref 6.0–8.3)

## 2023-05-26 LAB — LIPID PANEL
Cholesterol: 170 mg/dL (ref 0–200)
HDL: 67 mg/dL (ref >39.00)
LDL Cholesterol: 93 mg/dL (ref 0–99)
NonHDL: 103.38
Total CHOL/HDL Ratio: 3
Triglycerides: 50 mg/dL (ref 0.0–149.0)
VLDL: 10 mg/dL (ref 0.0–40.0)

## 2023-05-26 LAB — PSA: PSA: 0.98 ng/mL (ref 0.10–4.00)

## 2023-06-02 ENCOUNTER — Other Ambulatory Visit: Payer: Commercial Managed Care - HMO

## 2023-06-02 DIAGNOSIS — Z0000 Encounter for general adult medical examination without abnormal findings: Secondary | ICD-10-CM

## 2023-06-02 DIAGNOSIS — E291 Testicular hypofunction: Secondary | ICD-10-CM

## 2023-06-02 LAB — URINALYSIS, ROUTINE W REFLEX MICROSCOPIC
Bilirubin Urine: NEGATIVE
Hgb urine dipstick: NEGATIVE
Ketones, ur: NEGATIVE
Leukocytes,Ua: NEGATIVE
Nitrite: NEGATIVE
RBC / HPF: NONE SEEN (ref 0–?)
Specific Gravity, Urine: 1.01 (ref 1.000–1.030)
Total Protein, Urine: NEGATIVE
Urine Glucose: NEGATIVE
Urobilinogen, UA: 0.2 (ref 0.0–1.0)
WBC, UA: NONE SEEN (ref 0–?)
pH: 7 (ref 5.0–8.0)

## 2023-06-03 LAB — TESTOSTERONE TOTAL,FREE,BIO, MALES
Albumin: 3.9 g/dL (ref 3.6–5.1)
Sex Hormone Binding: 34 nmol/L (ref 22–77)
Testosterone, Bioavailable: 294.3 ng/dL (ref 110.0–575.0)
Testosterone, Free: 163.9 pg/mL (ref 46.0–224.0)
Testosterone: 1005 ng/dL — ABNORMAL HIGH (ref 250–827)

## 2023-06-06 ENCOUNTER — Ambulatory Visit (INDEPENDENT_AMBULATORY_CARE_PROVIDER_SITE_OTHER): Payer: Commercial Managed Care - HMO | Admitting: Family Medicine

## 2023-06-06 ENCOUNTER — Encounter: Payer: Self-pay | Admitting: Family Medicine

## 2023-06-06 VITALS — BP 120/74 | HR 68 | Temp 98.1°F | Ht 70.0 in | Wt 200.0 lb

## 2023-06-06 DIAGNOSIS — L309 Dermatitis, unspecified: Secondary | ICD-10-CM | POA: Diagnosis not present

## 2023-06-06 NOTE — Patient Instructions (Signed)
Use OTC antifungal cream on rash on forearm twice a day for 2 weeks. Use hydrocortisone 1% cream on upper arm rash twice a day.
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
## 2023-06-06 NOTE — Assessment & Plan Note (Signed)
The skin lesions are non-specific. As he does have a possible scaly ring with itching of the forearm, this may represent a tinea corporis. I recommend he use an OTC antifungal cream twice a day for 2 weeks to see if this will resolve. The upper arm rash is very nonspecific, maybe representing mosquito bites, but they have not been itchy. He could try using HC 1% cream.

## 2023-06-09 NOTE — Progress Notes (Signed)
This encounter was created in error - please disregard. This encounter was created in error - please disregard. This encounter was created in error - please disregard. 

## 2023-06-18 NOTE — Progress Notes (Signed)
Chart needing to be closed by PCP

## 2023-06-18 NOTE — Progress Notes (Deleted)
Error message

## 2023-06-30 ENCOUNTER — Other Ambulatory Visit: Payer: Self-pay

## 2023-06-30 ENCOUNTER — Encounter: Payer: Self-pay | Admitting: Neurology

## 2023-06-30 DIAGNOSIS — R202 Paresthesia of skin: Secondary | ICD-10-CM

## 2023-07-02 ENCOUNTER — Telehealth: Payer: Self-pay | Admitting: Family Medicine

## 2023-07-02 NOTE — Telephone Encounter (Signed)
Pt would like you to give him a call so he can discuss about his testosterone problemsand he google about some side effects

## 2023-07-10 ENCOUNTER — Ambulatory Visit (INDEPENDENT_AMBULATORY_CARE_PROVIDER_SITE_OTHER): Payer: Commercial Managed Care - HMO | Admitting: Family Medicine

## 2023-07-10 ENCOUNTER — Telehealth: Payer: Self-pay | Admitting: Neurology

## 2023-07-10 ENCOUNTER — Encounter: Payer: Self-pay | Admitting: Family Medicine

## 2023-07-10 VITALS — BP 142/68 | HR 69 | Temp 98.1°F | Ht 70.0 in | Wt 198.8 lb

## 2023-07-10 DIAGNOSIS — R202 Paresthesia of skin: Secondary | ICD-10-CM

## 2023-07-10 NOTE — Telephone Encounter (Signed)
Called patient and informed him that if the referring provider believes that this situation is emergent that provider needs to reach out to our office to speak to our physicians here. I informed patient that we can place him on a wait list, but that wouldn't be that helpful as he has the first day that Dr. Allena Katz does EMG's next week. Patient is aware that he is not an established patient here and that he is only scheduled for an EMG procedure only and not an office visit. Patient verbalized understanding.

## 2023-07-10 NOTE — Progress Notes (Signed)
Established Patient Office Visit   Subjective:  Patient ID: Tyler Ferguson, male    DOB: Mar 04, 1966  Age: 57 y.o. MRN: [id number]  Chief Complaint  Patient presents with   Medical Management of Chronic Issues    Pt wants to discuss testosterone replace and autoimmune disease.     HPI Encounter Diagnoses  Name Primary?   Paresthesias Yes   Ongoing history of worsening paresthesias in his right greater than left leg.  He has lost control of some of the motor function in his lower legs.  He is currently under the care of a neurosurgeon who has ordered an MRI of his lower back and cervical spine.  I do not have access to those results.  Nerve conduction studies have been ordered and will be performed by Dr. Allena Katz in the near future.  Distant history of back surgery  in 2007 that left him with ongoing fecal incontinence and saddle paresthesias.  He had seen me in mid June reporting an electric pain shooting down both of his legs that was predominantly on the right.  It was worse when he flexed his neck.  He was referred to neurosurgery as mentioned above.  He was started on Neurontin.  He has fallen a couple times.  He has gone from using a cane to using a walker.   ROS   Current Outpatient Medications:    Alanine POWD, Take 1 Scoop by mouth daily. When Pt goes to the gym, Disp: , Rfl:    Amino Acids (AMINO ACID PO), Take 3 capsules by mouth daily., Disp: , Rfl:    CREATINE MONOHYDRATE PO, Take 1 Scoop by mouth daily. When Pt goes to the gym, Disp: , Rfl:    gabapentin (NEURONTIN) 100 MG capsule, Take 1 capsule (100 mg total) by mouth at bedtime for 2 days, THEN 1 capsule (100 mg total) 2 (two) times daily for 2 days, THEN 1 capsule (100 mg total) 3 (three) times daily., Disp: 95 capsule, Rfl: 3   meloxicam (MOBIC) 15 MG tablet, Take 15 mg by mouth daily., Disp: , Rfl:    Multiple Vitamin (MULTIVITAMIN) capsule, Take 1 capsule by mouth daily., Disp: , Rfl:    NON FORMULARY, Take 4 capsules  by mouth See admin instructions. HMB/Vit D3 Take 4 capsules by mouth 2-3 times a day, Disp: , Rfl:    Omega-3 1000 MG CAPS, Take 1,000 mg by mouth daily., Disp: , Rfl:    sildenafil (VIAGRA) 100 MG tablet, TAKE 1/2 TO 1 (ONE-HALF TO ONE) TABLET BY MOUTH ONCE DAILY AS NEEDED FOR ERECTILE DYSFUNCTION, Disp: 20 tablet, Rfl: 4   testosterone cypionate (DEPOTESTOTERONE CYPIONATE) 100 MG/ML injection, INJECT 1 & 1/2 (ONE & ONE HALF) ML INTRAMUSCULARLY EVERY TWO WEEKS, Disp: 10 mL, Rfl: 0   Testosterone Cypionate 100 MG/ML SOLN, Inject 1 1/2 cc into muscle every 14 days., Disp: 20 mL, Rfl: 1  Current Facility-Administered Medications:    testosterone cypionate (DEPOTESTOSTERONE CYPIONATE) injection 150 mg, 150 mg, Intramuscular, Q14 Days, Mliss Sax, MD   testosterone cypionate (DEPOTESTOTERONE CYPIONATE) injection 100 mg, 100 mg, Intramuscular, Q14 Days, Mliss Sax, MD, 100 mg at 01/22/23 1525   testosterone cypionate (DEPOTESTOTERONE CYPIONATE) injection 150 mg, 150 mg, Intramuscular, Q14 Days, Mliss Sax, MD, 100 mg at 12/10/22 1509   Objective:     BP (!) 142/68   Pulse 69   Temp 98.1 F (36.7 C)   Ht 5\' 10"  (1.778 m)   Wt 198 lb  12.8 oz (90.2 kg)   SpO2 96%   BMI 28.52 kg/m  BP Readings from Last 3 Encounters:  07/10/23 (!) 142/68  06/06/23 120/74  05/22/23 132/80   Wt Readings from Last 3 Encounters:  07/10/23 198 lb 12.8 oz (90.2 kg)  06/06/23 200 lb (90.7 kg)  05/22/23 203 lb (92.1 kg)      Physical Exam   No results found for any visits on 07/10/23.    The ASCVD Risk score (Arnett DK, et al., 2019) failed to calculate for the following reasons:   Unable to determine if patient is Non-Hispanic African American    Assessment & Plan:   Paresthesias -     Vitamin B12; Future -     TSH; Future -     Hemoglobin A1c; Future -     Iron, TIBC and Ferritin Panel; Future -     Sedimentation rate; Future -     C-reactive protein;  Future -     Ambulatory referral to Neurology    Return in about 3 months (around 10/10/2023).  Ordered labs for workup of paresthesias and inflammatory markers.  Have referred him to Dr. Allena Katz who is performing the nerve conduction studies.  He understands that she will only be doing the test and he will be seen in her clinic on follow-up for today's requested consultation.  Again I asked him to discontinue the creatine.  Mliss Sax, MD

## 2023-07-10 NOTE — Telephone Encounter (Signed)
Pt just called, he seen his orthopedic surgeon this morning. They stated that his situation has progressed. Recommended that needs to be seen before his appointment on 08/08/20024.

## 2023-07-10 NOTE — Telephone Encounter (Signed)
Pt just called and stated that he seen his orthopedic surgeon this morning, they stated that his situation has progressed. Recommended that he needs to be seen before his appointment on 07/17/2023

## 2023-07-10 NOTE — Telephone Encounter (Signed)
See previous encounter. I havre spoken to patient.

## 2023-07-11 ENCOUNTER — Other Ambulatory Visit (INDEPENDENT_AMBULATORY_CARE_PROVIDER_SITE_OTHER): Payer: Commercial Managed Care - HMO

## 2023-07-11 DIAGNOSIS — R202 Paresthesia of skin: Secondary | ICD-10-CM | POA: Diagnosis not present

## 2023-07-11 LAB — VITAMIN B12: Vitamin B-12: 665 pg/mL (ref 211–911)

## 2023-07-11 LAB — SEDIMENTATION RATE: Sed Rate: 32 mm/hr — ABNORMAL HIGH (ref 0–20)

## 2023-07-11 LAB — C-REACTIVE PROTEIN: CRP: <1 mg/dL (ref 0.5–20.0)

## 2023-07-11 LAB — TSH: TSH: 0.68 u[IU]/mL (ref 0.35–5.50)

## 2023-07-11 LAB — HEMOGLOBIN A1C: Hgb A1c MFr Bld: 5.6 % (ref 4.6–6.5)

## 2023-07-14 ENCOUNTER — Telehealth: Payer: Self-pay | Admitting: Family Medicine

## 2023-07-14 NOTE — Telephone Encounter (Signed)
Pt received his lab results and is concerned about the FED rate. Please call.

## 2023-07-15 ENCOUNTER — Telehealth: Payer: Self-pay | Admitting: Family Medicine

## 2023-07-15 NOTE — Telephone Encounter (Signed)
error 

## 2023-07-17 ENCOUNTER — Encounter: Payer: Self-pay | Admitting: Neurology

## 2023-07-17 ENCOUNTER — Ambulatory Visit: Payer: Commercial Managed Care - HMO | Admitting: Neurology

## 2023-07-17 ENCOUNTER — Ambulatory Visit (INDEPENDENT_AMBULATORY_CARE_PROVIDER_SITE_OTHER): Payer: Commercial Managed Care - HMO | Admitting: Neurology

## 2023-07-17 VITALS — BP 143/77 | HR 65 | Ht 70.0 in | Wt 202.0 lb

## 2023-07-17 DIAGNOSIS — R29898 Other symptoms and signs involving the musculoskeletal system: Secondary | ICD-10-CM | POA: Diagnosis not present

## 2023-07-17 DIAGNOSIS — R292 Abnormal reflex: Secondary | ICD-10-CM | POA: Diagnosis not present

## 2023-07-17 DIAGNOSIS — R261 Paralytic gait: Secondary | ICD-10-CM | POA: Diagnosis not present

## 2023-07-17 DIAGNOSIS — R202 Paresthesia of skin: Secondary | ICD-10-CM

## 2023-07-17 NOTE — Procedures (Signed)
[facility]  [address] [place], [address]  [place], [place] [address] Tel: [phone] Fax: [phone] Test Date:  07/17/2023  Patient: Tyler Ferguson DOB: 1965/12/11 Physician: Nita Sickle, DO  Sex: Male Height: 5\' 10"  Ref Phys: Coletta Memos, MD  ID#: [id number]   Technician:    History: This is a 57 year old man with gait imbalance, bilateral leg numbness, and weakness.  NCV & EMG Findings: Extensive electrodiagnostic testing of the right lower extremity and additional studies of the left shows:  Bilateral sural and superficial peroneal sensory responses are within normal limits. Right peroneal motor responses within normal limits.  Left peroneal motor response is reduced at the extensor digitorum brevis, and normal at the tibialis anterior.  Bilateral tibial motor responses show reduced amplitudes. Tibial H reflex study is prolonged on the right, normal on the left.   Chronic motor axonal loss changes are seen affecting bilateral S1 and the left L5 myotome, without accompanied active denervation.  Impression: Chronic S1 radiculopathy affecting bilateral lower extremities, mild on the right and moderate on the left.   Chronic L5 radiculopathy affecting the left lower extremity, mild. In particular, there is no evidence of a large fiber sensorimotor polyneuropathy affecting the lower extremities.   ___________________________ Nita Sickle, DO    Nerve Conduction Studies   Stim Site NR Peak (ms) Norm Peak (ms) O-P Amp (V) Norm O-P Amp  Left Sup Peroneal Anti Sensory (Ant Lat Mall)  32 C  12 cm    3.5 <4.6 11.8 >4  Right Sup Peroneal Anti Sensory (Ant Lat Mall)  32 C  12 cm    3.4 <4.6 12.6 >4  Left Sural Anti Sensory (Lat Mall)  32 C  Calf    3.0 <4.6 16.7 >4  Right Sural Anti Sensory (Lat Mall)  32 C  Calf    2.8 <4.6 17.9 >4     Stim Site NR Onset (ms) Norm Onset (ms) O-P Amp (mV) Norm O-P Amp Site1 Site2 Delta-0 (ms) Dist (cm) Vel (m/s) Norm Vel (m/s)   Left Peroneal Motor (Ext Dig Brev)  32 C  Ankle    4.9 <6.0 *1.5 >2.5 B Fib Ankle 8.6 35.0 41 >40  B Fib    13.5  1.0  Poplt B Fib 1.7 8.0 47 >40  Poplt    15.2  1.0         Right Peroneal Motor (Ext Dig Brev)  32 C  Ankle    4.9 <6.0 4.1 >2.5 B Fib Ankle 9.2 40.0 43 >40  B Fib    14.1  3.8  Poplt B Fib 1.8 9.0 50 >40  Poplt    15.9  3.8         Left Peroneal TA Motor (Tib Ant)  32 C  Fib Head    3.5 <4.5 4.7 >3 Poplit Fib Head 1.0 6.0 60 >40  Poplit    4.5 <5.7 4.7         Left Tibial Motor (Abd Hall Brev)  32 C  Ankle    3.5 <6.0 *0.8 >4 Knee Ankle 10.3 41.0 40 >40  Knee    13.8  0.7         Right Tibial Motor (Abd Hall Brev)  32 C  Ankle    4.3 <6.0 *1.5 >4 Knee Ankle 9.4 41.0 44 >40  Knee    13.7  1.1          Electromyography   Side Muscle Ins.Act Fibs Fasc Recrt  Amp Dur Poly Activation Comment  Right AntTibialis Nml Nml Nml Nml Nml Nml Nml Nml N/A  Right Gastroc Nml Nml Nml *1- *1+ *1+ *1+ Nml N/A  Right Flex Dig Long Nml Nml Nml Nml Nml Nml Nml Nml N/A  Right RectFemoris Nml Nml Nml Nml Nml Nml Nml Nml N/A  Right GluteusMed Nml Nml Nml Nml Nml Nml Nml Nml N/A  Right BicepsFemS Nml Nml Nml *1- *1+ *1+ *1+ Nml N/A  Left Gastroc Nml Nml Nml *3- *1+ *1+ *1+ Nml N/A  Left AntTibialis Nml Nml Nml *1- *1+ *1+ *1+ Nml N/A  Left BicepsFemS Nml Nml Nml *2- *1+ *1+ *1+ Nml N/A  Left RectFemoris Nml Nml Nml Nml Nml Nml Nml Nml N/A      Waveforms:

## 2023-07-17 NOTE — Progress Notes (Signed)
[facility] Clinic Note - Initial Visit   Date: 07/17/2023   Tyler Ferguson MRN: [id number] DOB: Nov 20, 1966   Dear Dr. Doreene Burke:  Thank you for your kind referral of Tyler Ferguson for consultation of right leg numbness, weakness, and gait difficulty. Although his history is well known to you, please allow [place] to reiterate it for the purpose of our medical record. The patient was accompanied to the clinic by fiance who also provides collateral information.     Tyler Ferguson is a 57 y.o. right-handed male with low testosterone presenting for evaluation of right leg numbness and gait unsteadiness.   IMPRESSION/PLAN: This is a 57 year old man who is otherwise very active and healthy until June 2024 presenting with progressive gait difficulty, right > left lower extremity numbness, bilateral leg weakness, Lhermitte's sign, and lack of coordination in the right leg.  Exam shows dense sensory loss over the right leg, left foot, bilateral hip flexion weakness, brisk patella reflexes, with markedly abnormal gait which appears spastic, however muscle tone on exam is normal.  MRI cervical and contrasted lumbar spine did not show spinal cord pathology. There is multilevel cervical and lumbosacral spondylosis with foraminal stenosis, however, this would not explain his symptoms.  Further, NCS/EMG of the legs was performed today which does not show evidence of neuropathy.   The next step is to get MRI thoracic spine wwo contrast and to be complete, I will also check MRI brain wwo contrast.    Further recommendations pending results.   ------------------------------------------------------------- History of present illness: Starting around the first week of June 2024, he was at the gym working on the ab machine and each time he extended his neck, he has electrical shock-like sensation down his body.  He did not have the sensation triggered with neck flexion.  The same day, he  began noticing numbness from the knee down into the foot and difficulty controlling the movement of his right leg.    A week later, he began having numbness in the sole of the left foot.  Over the past few weeks, he has noticed increased numbness in the right hip, buttocks, and thigh all the way into the foot.  He also has numbness in the left foot extended into the ankle and entire foot.  He also has weakness in both legs, worse in the right.  He has fall 3 times and has been using a walker for the past 6 weeks.    He saw Dr. Franky Macho for these symptoms who ordered MRI cervical and lumbar spine which shows spondylosis and foraminal stenosis at various levels, but no evidence of cord impingement.   Prior to his symptoms, he recalls having an insect bite on the right forearm.    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    Out-side paper records, electronic medical record, and images have been reviewed where available and summarized as:  MRI cervical spine and lumbar spine 06/26/2023: 1. No acute findings or clear explanation for the patient's symptoms in the cervical or lumbar spine. 2. Multilevel cervical and lumbar spondylosis with disc bulging, endplate osteophytes and facet hypertrophy as described. No high-grade spinal stenosis or cord deformity. 3. Multilevel cervical foraminal narrowing which appears chronic and greatest on the right at C4-5 and bilaterally at C5-6 and C6-7. 4. Mild multifactorial spinal stenosis at L2-3 and L3-4. At L3-4, there  is moderate right lateral recess narrowing due to a prominent osteophyte projecting into the right lateral recess. 5. Stable postsurgical changes on the right at L4-5 with good decompression of the spinal canal. There is mild residual right lateral recess narrowing and moderate left foraminal narrowing. 6. Moderate left foraminal  narrowing at L5-S1.   Lab Results  Component Value Date   HGBA1C 5.6 07/11/2023   Lab Results  Component Value Date   VITAMINB12 665 07/11/2023   Lab Results  Component Value Date   TSH 0.68 07/11/2023   Lab Results  Component Value Date   ESRSEDRATE 32 (H) 07/11/2023    No past medical history on file.  Past Surgical History:  Procedure Laterality Date   BACK SURGERY  2007   COLONOSCOPY  03/25/2022     Medications:  Outpatient Encounter Medications as of 07/17/2023  Medication Sig   Amino Acids (AMINO ACID PO) Take 3 capsules by mouth daily.   Multiple Vitamin (MULTIVITAMIN) capsule Take 1 capsule by mouth daily.   NON FORMULARY Take 4 capsules by mouth See admin instructions. HMB/Vit D3 Take 4 capsules by mouth 2-3 times a day   sildenafil (VIAGRA) 100 MG tablet TAKE 1/2 TO 1 (ONE-HALF TO ONE) TABLET BY MOUTH ONCE DAILY AS NEEDED FOR ERECTILE DYSFUNCTION   Testosterone Cypionate 100 MG/ML SOLN Inject 1 1/2 cc into muscle every 14 days.   [DISCONTINUED] testosterone cypionate (DEPOTESTOTERONE CYPIONATE) 100 MG/ML injection INJECT 1 & 1/2 (ONE & ONE HALF) ML INTRAMUSCULARLY EVERY TWO WEEKS   [DISCONTINUED] Alanine POWD Take 1 Scoop by mouth daily. When Pt goes to the gym   [DISCONTINUED] CREATINE MONOHYDRATE PO Take 1 Scoop by mouth daily. When Pt goes to the gym   [DISCONTINUED] gabapentin (NEURONTIN) 100 MG capsule Take 1 capsule (100 mg total) by mouth at bedtime for 2 days, THEN 1 capsule (100 mg total) 2 (two) times daily for 2 days, THEN 1 capsule (100 mg total) 3 (three) times daily.   [DISCONTINUED] meloxicam (MOBIC) 15 MG tablet Take 15 mg by mouth daily.   [DISCONTINUED] Omega-3 1000 MG CAPS Take 1,000 mg by mouth daily. (Patient not taking: Reported on 07/17/2023)   Facility-Administered Encounter Medications as of 07/17/2023  Medication   testosterone cypionate (DEPOTESTOSTERONE CYPIONATE) injection 150 mg   testosterone cypionate (DEPOTESTOTERONE CYPIONATE)  injection 100 mg   testosterone cypionate (DEPOTESTOTERONE CYPIONATE) injection 150 mg    Allergies: No Known Allergies  Family History: Family History  Problem Relation Age of Onset   Heart disease Mother    Congestive Heart Failure Mother    Cancer Father        Prostate Cancer   Cancer Sister    Colon cancer Neg Hx    Colon polyps Neg Hx    Esophageal cancer Neg Hx    Rectal cancer Neg Hx    Stomach cancer Neg Hx     Social History: Social History   Tobacco Use   Smoking status: Former    Current packs/day: 1.00    Types: Cigarettes   Smokeless tobacco: Never  Vaping Use   Vaping status: Never Used  Substance Use Topics   Alcohol use: Yes    Alcohol/week: 3.0 standard drinks of alcohol    Types: 3 Cans of beer per week    Comment: social   Drug use: Never   Social History   Social History Narrative   Not on file    Vital Signs:  BP (!) 143/77   Pulse  65   Ht 5\' 10"  (1.778 m)   Wt 202 lb (91.6 kg)   SpO2 96%   BMI 28.98 kg/m     Neurological Exam: MENTAL STATUS including orientation to time, place, person, recent and remote memory, attention span and concentration, language, and fund of knowledge is normal.  Speech is not dysarthric.  CRANIAL NERVES: II:  No visual field defects.     III-IV-VI: Pupils equal round and reactive to light.  Normal conjugate, extra-ocular eye movements in all directions of gaze.  No nystagmus.  No ptosis.   V:  Normal facial sensation.    VII:  Normal facial symmetry and movements.   VIII:  Normal hearing and vestibular function.   IX-X:  Normal palatal movement.   XI:  Normal shoulder shrug and head rotation.   XII:  Normal tongue strength and range of motion, no deviation or fasciculation.  MOTOR:  There is loss of muscle bulk in the legs, notably in the calf.  No fasciculations or abnormal movements.  No pronator drift.   Upper Extremity:  Right  Left  Deltoid  5/5   5/5   Biceps  5/5   5/5   Triceps  5/5   5/5    Infraspinatus 5/5  5-/5  Medial pectoralis 5/5  5/5  Wrist extensors  5/5   5/5   Wrist flexors  5/5   5/5   Finger extensors  5/5   5/5   Finger flexors  5/5   5/5   Dorsal interossei  5/5   5/5   Tone (Ashworth scale)  0  0   Lower Extremity:  Right  Left  Hip flexors  4/5   4/5   Hip extensors  5/5   5/5   Adductor 5/5  5/5  Abductor 5/5  5/5  Knee flexors  5/5   5/5   Knee extensors  5/5   5/5   Dorsiflexors  5/5   5/5   Plantarflexors  5/5   5/5   Toe extensors  5/5   5/5   Toe flexors  5/5   5/5   Tone (Ashworth scale)  0  0   MSRs:                                           Right        Left brachioradialis 2+  2+  biceps 1+  1+  triceps 2+  2+  patellar 3+  3+  ankle jerk 0  0  Hoffman no  no  plantar response down  down  Crossed adductors right > left  SENSORY:  Absent sensation to all modalities over the entire right leg (thigh, lower leg, and foot) as well as the left foot distal to the ankle.  Normal vibration at the left knee and MCP bilaterally.  Temperature and pin prick is also reduced over the left upper leg.  Sensation is normal in the hands and arms.  There is no sensory level.   COORDINATION/GAIT: Normal finger-to- nose-finger.  Marked dysmetria with heel-to-shin testing bilaterally, worse on the right. Finger tapping intact.  Heel and toe tapping impaired bilaterally.  Severely abnormal gait, which appears spastic with extension of the right knee and limited, if any, flexion at the knee with walking.  Gait is wide-based, ataxic, and unsteady assisted with cane.   Total time spent  reviewing records, interview, history/exam, documentation, and coordination of care on day of encounter:  70 min     Thank you for allowing me to participate in patient's care.  If I can answer any additional questions, I would be pleased to do so.    Sincerely,    Seymore Brodowski K. Allena Katz, DO

## 2023-07-18 ENCOUNTER — Telehealth: Payer: Self-pay | Admitting: Neurology

## 2023-07-18 ENCOUNTER — Encounter: Payer: Commercial Managed Care - HMO | Admitting: Neurology

## 2023-07-18 NOTE — Telephone Encounter (Signed)
Patient is needing a call back regarding Surveyor, mining for [employer]

## 2023-07-18 NOTE — Telephone Encounter (Signed)
Called and spoke to patient and he wanted to let me know that his PA is in clinical review and he had to have his MRI's canceled for Monday. I informed patient that I will check with [facility] and follow up on his PA and see if we can get him in sooner. Sent a message to Helene Kelp to check status.

## 2023-07-21 ENCOUNTER — Other Ambulatory Visit: Payer: Commercial Managed Care - HMO

## 2023-08-04 ENCOUNTER — Other Ambulatory Visit: Payer: Commercial Managed Care - HMO

## 2023-08-07 ENCOUNTER — Other Ambulatory Visit: Payer: Self-pay | Admitting: Neurosurgery

## 2023-08-14 ENCOUNTER — Other Ambulatory Visit: Payer: Self-pay

## 2023-08-14 ENCOUNTER — Encounter (HOSPITAL_COMMUNITY): Payer: Self-pay | Admitting: Neurosurgery

## 2023-08-14 NOTE — Progress Notes (Signed)
SDW CALL  Patient was given pre-op instructions over the phone. The opportunity was given for the patient to ask questions. No further questions asked. Patient verbalized understanding of instructions given.   PCP - Yves Dill Cardiologist - Denies  PPM/ICD - Denies Device Orders -  Rep Notified -   Chest x-ray - na EKG - na Stress Test - denies ECHO - denies Cardiac Cath - denies  Sleep Study - denies CPAP - no  Fasting Blood Sugar - na Checks Blood Sugar _____ times a day  Blood Thinner Instructions:na Aspirin Instructions:na  ERAS Protcol - clear liquids until 1245 PRE-SURGERY Ensure or G2- no  COVID TEST- na   Anesthesia review: no  Patient denies shortness of breath, fever, cough and chest pain over the phone call    Surgical Instructions    Your procedure is scheduled on September 6  Report to [facility] Main Entrance "A" at 1:15 P.M., then check in with the Admitting office.  Call this number if you have problems the morning of surgery:  [phone]    Remember:  Do not eat after midnight the night before your surgery  You may drink clear liquids until 12:45 the day of your surgery.   Clear liquids allowed are: Water, Non-Citrus Juices (without pulp), Carbonated Beverages, Clear Tea, Black Coffee ONLY (NO MILK, CREAM OR POWDERED CREAMER of any kind), and Gatorade   Take these medicines the morning of surgery with A SIP OF WATER: none   As of today, STOP taking any Aspirin (unless otherwise instructed by your surgeon) Aleve, Naproxen, Ibuprofen, Motrin, Advil, Goody's, BC's, all herbal medications, fish oil, and all vitamins.  [facility] is not responsible for any belongings or valuables. .   Do NOT Smoke (Tobacco/Vaping)  24 hours prior to your procedure  If you use a CPAP at night, you may bring your mask for your overnight stay.   Contacts, glasses, hearing aids, dentures or partials may not be worn into surgery, please bring cases for  these belongings   Patients discharged the day of surgery will not be allowed to drive home, and someone needs to stay with them for 24 hours.  Special instructions:    Oral Hygiene is also important to reduce your risk of infection.  Remember - BRUSH YOUR TEETH THE MORNING OF SURGERY WITH YOUR REGULAR TOOTHPASTE   Day of Surgery:  Take a shower the day of or night before with antibacterial soap. Wear Clean/Comfortable clothing the morning of surgery Do not apply any deodorants/lotions.   Do not wear jewelry or makeup Do not wear lotions, powders, perfumes/colognes, or deodorant. Do not shave 48 hours prior to surgery.  Men may shave face and neck. Do not bring valuables to the hospital. Do not wear nail polish, gel polish, artificial nails, or any other type of covering on natural nails (fingers and toes) If you have artificial nails or gel coating that need to be removed by a nail salon, please have this removed prior to surgery. Artificial nails or gel coating may interfere with anesthesia's ability to adequately monitor your vital signs. Remember to brush your teeth WITH YOUR REGULAR TOOTHPASTE.

## 2023-08-15 ENCOUNTER — Other Ambulatory Visit: Payer: Self-pay

## 2023-08-15 ENCOUNTER — Ambulatory Visit (HOSPITAL_COMMUNITY)
Admission: RE | Admit: 2023-08-15 | Discharge: 2023-08-15 | Disposition: A | Discharge status: Home / Self Care | Payer: Commercial Managed Care - HMO | Attending: Neurosurgery | Admitting: Neurosurgery

## 2023-08-15 ENCOUNTER — Encounter (HOSPITAL_COMMUNITY): Payer: Self-pay | Admitting: Neurosurgery

## 2023-08-15 DIAGNOSIS — M2578 Osteophyte, vertebrae: Secondary | ICD-10-CM | POA: Diagnosis not present

## 2023-08-15 DIAGNOSIS — M4804 Spinal stenosis, thoracic region: Secondary | ICD-10-CM | POA: Diagnosis present

## 2023-08-15 DIAGNOSIS — Z538 Procedure and treatment not carried out for other reasons: Secondary | ICD-10-CM | POA: Insufficient documentation

## 2023-08-15 DIAGNOSIS — R269 Unspecified abnormalities of gait and mobility: Secondary | ICD-10-CM | POA: Insufficient documentation

## 2023-08-15 HISTORY — DX: Unspecified osteoarthritis, unspecified site: M19.90

## 2023-08-15 LAB — CBC
HCT: 44.4 % (ref 39.0–52.0)
Hemoglobin: 14.9 g/dL (ref 13.0–17.0)
MCH: 31.3 pg (ref 26.0–34.0)
MCHC: 33.6 g/dL (ref 30.0–36.0)
MCV: 93.3 fL (ref 80.0–100.0)
Platelets: 268 10*3/uL (ref 150–400)
RBC: 4.76 MIL/uL (ref 4.22–5.81)
RDW: 13.1 % (ref 11.5–15.5)
WBC: 7 10*3/uL (ref 4.0–10.5)
nRBC: 0 % (ref 0.0–0.2)

## 2023-08-15 LAB — ABO/RH: ABO/RH(D): O POS

## 2023-08-15 LAB — SURGICAL PCR SCREEN
MRSA, PCR: NEGATIVE
Staphylococcus aureus: POSITIVE — AB

## 2023-08-15 LAB — TYPE AND SCREEN
ABO/RH(D): O POS
Antibody Screen: NEGATIVE

## 2023-08-15 MED ORDER — CHLORHEXIDINE GLUCONATE CLOTH 2 % EX PADS: 6.0000 | MEDICATED_PAD | Freq: Once | CUTANEOUS | Status: DC

## 2023-08-15 MED ORDER — DEXAMETHASONE SODIUM PHOSPHATE 10 MG/ML IJ SOLN
INTRAMUSCULAR | Status: AC
  Filled 2023-08-15: qty 1

## 2023-08-15 MED ORDER — CEFAZOLIN SODIUM-DEXTROSE 2-4 GM/100ML-% IV SOLN
2.0000 g | INTRAVENOUS | Status: DC
  Administered 2023-08-16: 2 g via INTRAVENOUS
  Filled 2023-08-15: qty 100

## 2023-08-15 MED ORDER — KETAMINE HCL 50 MG/5ML IJ SOSY
PREFILLED_SYRINGE | INTRAMUSCULAR | Status: AC
  Filled 2023-08-15: qty 5

## 2023-08-15 MED ORDER — FENTANYL CITRATE (PF) 250 MCG/5ML IJ SOLN
INTRAMUSCULAR | Status: AC
  Filled 2023-08-15: qty 5

## 2023-08-15 MED ORDER — ONDANSETRON HCL 4 MG/2ML IJ SOLN
INTRAMUSCULAR | Status: AC
  Filled 2023-08-15: qty 2

## 2023-08-15 MED ORDER — ROCURONIUM BROMIDE 10 MG/ML (PF) SYRINGE
PREFILLED_SYRINGE | INTRAVENOUS | Status: AC
  Filled 2023-08-15: qty 10

## 2023-08-15 MED ORDER — LIDOCAINE 2% (20 MG/ML) 5 ML SYRINGE
INTRAMUSCULAR | Status: AC
  Filled 2023-08-15: qty 5

## 2023-08-15 MED ORDER — MIDAZOLAM HCL 2 MG/2ML IJ SOLN
INTRAMUSCULAR | Status: AC
  Filled 2023-08-15: qty 2

## 2023-08-15 MED ORDER — ORAL CARE MOUTH RINSE: 15.0000 mL | Freq: Once | OROMUCOSAL | Status: AC

## 2023-08-15 MED ORDER — LACTATED RINGERS IV SOLN: INTRAVENOUS | Status: DC

## 2023-08-15 MED ORDER — PROPOFOL 10 MG/ML IV BOLUS
INTRAVENOUS | Status: AC
  Filled 2023-08-15: qty 20

## 2023-08-15 MED ORDER — CHLORHEXIDINE GLUCONATE 0.12 % MT SOLN
15.0000 mL | Freq: Once | OROMUCOSAL | Status: AC
  Administered 2023-08-15: 15 mL via OROMUCOSAL
  Filled 2023-08-15: qty 15

## 2023-08-15 NOTE — Anesthesia Preprocedure Evaluation (Signed)
Anesthesia Evaluation  Patient identified by MRN, date of birth, ID band Patient awake    Reviewed: Allergy & Precautions, NPO status , Patient's Chart, lab work & pertinent test results, reviewed documented beta blocker date and time   History of Anesthesia Complications Negative for: history of anesthetic complications  Airway Mallampati: III  TM Distance: >3 FB Neck ROM: Full    Dental no notable dental hx.    Pulmonary neg shortness of breath, neg COPD, former smoker   breath sounds clear to auscultation       Cardiovascular (-) hypertension(-) angina (-) CAD, (-) Past MI and (-) CHF  Rhythm:Regular Rate:Normal     Neuro/Psych  Neuromuscular disease    GI/Hepatic ,neg GERD  ,,(+) neg Cirrhosis        Endo/Other  neg diabetes    Renal/GU      Musculoskeletal  (+) Arthritis ,    Abdominal   Peds  Hematology   Anesthesia Other Findings   Reproductive/Obstetrics                              Anesthesia Physical Anesthesia Plan  ASA: 2  Anesthesia Plan: General   Post-op Pain Management:    Induction: Intravenous  PONV Risk Score and Plan: 1 and Ondansetron  Airway Management Planned: Oral ETT  Additional Equipment:   Intra-op Plan:   Post-operative Plan: Extubation in OR  Informed Consent: I have reviewed the patients History and Physical, chart, labs and discussed the procedure including the risks, benefits and alternatives for the proposed anesthesia with the patient or authorized representative who has indicated his/her understanding and acceptance.     Dental advisory given  Plan Discussed with: CRNA  Anesthesia Plan Comments:          Anesthesia Quick Evaluation
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
## 2023-08-15 NOTE — Progress Notes (Signed)
Patient's surgery was moved from tonight to tomorrow at 0845. Patient instructed to arrive at 0645 tomorrow. Patient dressed, IV removed, and discharged.
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
## 2023-08-16 ENCOUNTER — Ambulatory Visit (HOSPITAL_BASED_OUTPATIENT_CLINIC_OR_DEPARTMENT_OTHER): Payer: Commercial Managed Care - HMO | Admitting: Anesthesiology

## 2023-08-16 ENCOUNTER — Ambulatory Visit (HOSPITAL_COMMUNITY)
Admission: AD | Disposition: A | Discharge status: Home Health | Payer: Self-pay | Source: Ambulatory Visit | Attending: Neurosurgery

## 2023-08-16 ENCOUNTER — Ambulatory Visit (HOSPITAL_COMMUNITY): Payer: Commercial Managed Care - HMO | Admitting: Anesthesiology

## 2023-08-16 ENCOUNTER — Other Ambulatory Visit: Payer: Self-pay

## 2023-08-16 ENCOUNTER — Ambulatory Visit (HOSPITAL_COMMUNITY): Payer: Commercial Managed Care - HMO

## 2023-08-16 ENCOUNTER — Observation Stay (HOSPITAL_COMMUNITY)
Admission: AD | Admit: 2023-08-16 | Discharge: 2023-08-19 | Disposition: A | Discharge status: Home Health | Payer: Commercial Managed Care - HMO | Source: Ambulatory Visit | Attending: Neurosurgery | Admitting: Neurosurgery

## 2023-08-16 DIAGNOSIS — M5104 Intervertebral disc disorders with myelopathy, thoracic region: Secondary | ICD-10-CM | POA: Diagnosis not present

## 2023-08-16 DIAGNOSIS — M4804 Spinal stenosis, thoracic region: Secondary | ICD-10-CM | POA: Insufficient documentation

## 2023-08-16 DIAGNOSIS — M5186 Other intervertebral disc disorders, lumbar region: Principal | ICD-10-CM | POA: Insufficient documentation

## 2023-08-16 DIAGNOSIS — M5126 Other intervertebral disc displacement, lumbar region: Secondary | ICD-10-CM | POA: Diagnosis not present

## 2023-08-16 HISTORY — PX: LUMBAR LAMINECTOMY/DECOMPRESSION MICRODISCECTOMY: SHX5026

## 2023-08-16 HISTORY — PX: THORACIC DISCECTOMY: SHX6113

## 2023-08-16 LAB — CBC
HCT: 39.9 % (ref 39.0–52.0)
Hemoglobin: 13.4 g/dL (ref 13.0–17.0)
MCH: 31 pg (ref 26.0–34.0)
MCHC: 33.6 g/dL (ref 30.0–36.0)
MCV: 92.4 fL (ref 80.0–100.0)
Platelets: 247 10*3/uL (ref 150–400)
RBC: 4.32 MIL/uL (ref 4.22–5.81)
RDW: 13 % (ref 11.5–15.5)
WBC: 10.3 10*3/uL (ref 4.0–10.5)
nRBC: 0 % (ref 0.0–0.2)

## 2023-08-16 LAB — CREATININE, SERUM
Creatinine, Ser: 0.83 mg/dL (ref 0.61–1.24)
GFR, Estimated: >60 mL/min (ref >60)

## 2023-08-16 SURGERY — THORACIC DISCECTOMY
Anesthesia: General | Site: Back | Laterality: Right

## 2023-08-16 MED ORDER — KETAMINE HCL 50 MG/5ML IJ SOSY
PREFILLED_SYRINGE | INTRAMUSCULAR | Status: AC
  Filled 2023-08-16: qty 5

## 2023-08-16 MED ORDER — BUPIVACAINE HCL (PF) 0.5 % IJ SOLN
INTRAMUSCULAR | Status: AC
  Filled 2023-08-16: qty 30

## 2023-08-16 MED ORDER — MIDAZOLAM HCL 2 MG/2ML IJ SOLN
INTRAMUSCULAR | Status: AC
  Filled 2023-08-16: qty 2

## 2023-08-16 MED ORDER — DEXAMETHASONE SODIUM PHOSPHATE 10 MG/ML IJ SOLN
INTRAMUSCULAR | Status: DC | PRN
  Administered 2023-08-16: 10 mg via INTRAVENOUS

## 2023-08-16 MED ORDER — BUPIVACAINE HCL (PF) 0.5 % IJ SOLN
INTRAMUSCULAR | Status: DC | PRN
Start: 2023-08-16 — End: 2023-08-16
  Administered 2023-08-16: 10 mL

## 2023-08-16 MED ORDER — MENTHOL 3 MG MT LOZG: 1.0000 | LOZENGE | OROMUCOSAL | Status: DC | PRN

## 2023-08-16 MED ORDER — EPHEDRINE SULFATE-NACL 50-0.9 MG/10ML-% IV SOSY
PREFILLED_SYRINGE | INTRAVENOUS | Status: DC | PRN
  Administered 2023-08-16: 10 mg via INTRAVENOUS

## 2023-08-16 MED ORDER — MIDAZOLAM HCL 2 MG/2ML IJ SOLN
INTRAMUSCULAR | Status: DC | PRN
  Administered 2023-08-16: 2 mg via INTRAVENOUS

## 2023-08-16 MED ORDER — ACETAMINOPHEN 325 MG PO TABS: 650.0000 mg | ORAL_TABLET | ORAL | Status: DC | PRN

## 2023-08-16 MED ORDER — PHENOL 1.4 % MT LIQD: 1.0000 | OROMUCOSAL | Status: DC | PRN

## 2023-08-16 MED ORDER — CHLORHEXIDINE GLUCONATE 0.12 % MT SOLN: 15.0000 mL | Freq: Once | OROMUCOSAL | Status: AC

## 2023-08-16 MED ORDER — OXYCODONE HCL 5 MG PO TABS
10.0000 mg | ORAL_TABLET | ORAL | Status: DC | PRN
  Administered 2023-08-17: 10 mg via ORAL
  Filled 2023-08-16: qty 2

## 2023-08-16 MED ORDER — CHLORHEXIDINE GLUCONATE 0.12 % MT SOLN
OROMUCOSAL | Status: AC
  Administered 2023-08-16: 15 mL via OROMUCOSAL
  Filled 2023-08-16: qty 15

## 2023-08-16 MED ORDER — SUCCINYLCHOLINE CHLORIDE 200 MG/10ML IV SOSY
PREFILLED_SYRINGE | INTRAVENOUS | Status: DC | PRN
  Administered 2023-08-16: 200 mg via INTRAVENOUS

## 2023-08-16 MED ORDER — KETOROLAC TROMETHAMINE 0.5 % OP SOLN
1.0000 [drp] | Freq: Four times a day (QID) | OPHTHALMIC | Status: AC
  Administered 2023-08-16 – 2023-08-17 (×3): 1 [drp] via OPHTHALMIC
  Filled 2023-08-16 (×2): qty 5

## 2023-08-16 MED ORDER — SODIUM CHLORIDE 0.9% FLUSH
3.0000 mL | Freq: Two times a day (BID) | INTRAVENOUS | Status: DC
  Administered 2023-08-16 – 2023-08-19 (×6): 3 mL via INTRAVENOUS

## 2023-08-16 MED ORDER — CELECOXIB 200 MG PO CAPS
200.0000 mg | ORAL_CAPSULE | Freq: Two times a day (BID) | ORAL | Status: DC
  Administered 2023-08-16 – 2023-08-19 (×7): 200 mg via ORAL
  Filled 2023-08-16 (×7): qty 1

## 2023-08-16 MED ORDER — FENTANYL CITRATE (PF) 100 MCG/2ML IJ SOLN
25.0000 ug | INTRAMUSCULAR | Status: DC | PRN
  Administered 2023-08-16: 50 ug via INTRAVENOUS

## 2023-08-16 MED ORDER — LIDOCAINE-EPINEPHRINE 0.5 %-1:200000 IJ SOLN
INTRAMUSCULAR | Status: AC
  Filled 2023-08-16: qty 50

## 2023-08-16 MED ORDER — THROMBIN 5000 UNITS EX SOLR: OROMUCOSAL | Status: DC | PRN

## 2023-08-16 MED ORDER — SODIUM CHLORIDE 0.9% FLUSH: 3.0000 mL | INTRAVENOUS | Status: DC | PRN

## 2023-08-16 MED ORDER — ONDANSETRON HCL 4 MG/2ML IJ SOLN
INTRAMUSCULAR | Status: DC | PRN
  Administered 2023-08-16 (×2): 4 mg via INTRAVENOUS

## 2023-08-16 MED ORDER — CEFAZOLIN SODIUM-DEXTROSE 2-4 GM/100ML-% IV SOLN
2.0000 g | Freq: Three times a day (TID) | INTRAVENOUS | Status: AC
  Administered 2023-08-17: 2 g via INTRAVENOUS
  Filled 2023-08-16 (×2): qty 100

## 2023-08-16 MED ORDER — BSS IO SOLN
INTRAOCULAR | Status: AC
  Filled 2023-08-16: qty 15

## 2023-08-16 MED ORDER — FENTANYL CITRATE (PF) 100 MCG/2ML IJ SOLN
INTRAMUSCULAR | Status: AC
  Administered 2023-08-16: 25 ug via INTRAVENOUS
  Filled 2023-08-16: qty 2

## 2023-08-16 MED ORDER — DEXAMETHASONE SODIUM PHOSPHATE 10 MG/ML IJ SOLN
INTRAMUSCULAR | Status: AC
  Filled 2023-08-16: qty 1

## 2023-08-16 MED ORDER — PHENYLEPHRINE HCL-NACL 20-0.9 MG/250ML-% IV SOLN
INTRAVENOUS | Status: DC | PRN
  Administered 2023-08-16: 10 ug/min via INTRAVENOUS

## 2023-08-16 MED ORDER — PROPOFOL 10 MG/ML IV BOLUS
INTRAVENOUS | Status: DC | PRN
  Administered 2023-08-16: 150 mg via INTRAVENOUS

## 2023-08-16 MED ORDER — OXYCODONE HCL 5 MG PO TABS
5.0000 mg | ORAL_TABLET | ORAL | Status: DC | PRN
  Administered 2023-08-18: 5 mg via ORAL
  Filled 2023-08-16: qty 1

## 2023-08-16 MED ORDER — POLYMYXIN B-TRIMETHOPRIM 10000-0.1 UNIT/ML-% OP SOLN
1.0000 [drp] | Freq: Four times a day (QID) | OPHTHALMIC | Status: AC
  Administered 2023-08-16 – 2023-08-17 (×4): 1 [drp] via OPHTHALMIC
  Filled 2023-08-16: qty 10

## 2023-08-16 MED ORDER — FENTANYL CITRATE (PF) 250 MCG/5ML IJ SOLN
INTRAMUSCULAR | Status: AC
  Filled 2023-08-16: qty 5

## 2023-08-16 MED ORDER — CEFAZOLIN SODIUM-DEXTROSE 2-4 GM/100ML-% IV SOLN
INTRAVENOUS | Status: AC
  Administered 2023-08-16: 2 g via INTRAVENOUS
  Filled 2023-08-16: qty 100

## 2023-08-16 MED ORDER — ORAL CARE MOUTH RINSE: 15.0000 mL | Freq: Once | OROMUCOSAL | Status: AC

## 2023-08-16 MED ORDER — ACETAMINOPHEN 650 MG RE SUPP: 650.0000 mg | RECTAL | Status: DC | PRN

## 2023-08-16 MED ORDER — KETAMINE HCL 10 MG/ML IJ SOLN
INTRAMUSCULAR | Status: DC | PRN
  Administered 2023-08-16: 30 mg via INTRAVENOUS
  Administered 2023-08-16 (×2): 10 mg via INTRAVENOUS

## 2023-08-16 MED ORDER — OXYCODONE HCL 5 MG/5ML PO SOLN: 5.0000 mg | Freq: Once | ORAL | Status: DC | PRN

## 2023-08-16 MED ORDER — LIDOCAINE-EPINEPHRINE 0.5 %-1:200000 IJ SOLN
INTRAMUSCULAR | Status: DC | PRN
  Administered 2023-08-16: 10 mL via INTRADERMAL

## 2023-08-16 MED ORDER — DIAZEPAM 5 MG PO TABS
5.0000 mg | ORAL_TABLET | Freq: Four times a day (QID) | ORAL | Status: DC | PRN
  Administered 2023-08-16 – 2023-08-19 (×7): 5 mg via ORAL
  Filled 2023-08-16 (×7): qty 1

## 2023-08-16 MED ORDER — KETOROLAC TROMETHAMINE 0.5 % OP SOLN
1.0000 [drp] | Freq: Four times a day (QID) | OPHTHALMIC | Status: DC
  Administered 2023-08-16: 1 [drp] via OPHTHALMIC

## 2023-08-16 MED ORDER — ALBUMIN HUMAN 5 % IV SOLN
INTRAVENOUS | Status: DC | PRN
Start: 2023-08-16 — End: 2023-08-16

## 2023-08-16 MED ORDER — LACTATED RINGERS IV SOLN: INTRAVENOUS | Status: DC

## 2023-08-16 MED ORDER — ONDANSETRON HCL 4 MG/2ML IJ SOLN: 4.0000 mg | Freq: Four times a day (QID) | INTRAMUSCULAR | Status: DC | PRN

## 2023-08-16 MED ORDER — ROCURONIUM BROMIDE 10 MG/ML (PF) SYRINGE
PREFILLED_SYRINGE | INTRAVENOUS | Status: AC
  Filled 2023-08-16: qty 10

## 2023-08-16 MED ORDER — THROMBIN (RECOMBINANT) 5000 UNITS EX SOLR: CUTANEOUS | Status: DC | PRN

## 2023-08-16 MED ORDER — FENTANYL CITRATE (PF) 250 MCG/5ML IJ SOLN
INTRAMUSCULAR | Status: DC | PRN
  Administered 2023-08-16: 100 ug via INTRAVENOUS
  Administered 2023-08-16 (×3): 50 ug via INTRAVENOUS

## 2023-08-16 MED ORDER — ACETAMINOPHEN 10 MG/ML IV SOLN: 1000.0000 mg | Freq: Once | INTRAVENOUS | Status: DC | PRN

## 2023-08-16 MED ORDER — SENNOSIDES-DOCUSATE SODIUM 8.6-50 MG PO TABS
1.0000 | ORAL_TABLET | Freq: Every evening | ORAL | Status: DC | PRN
  Administered 2023-08-19: 1 via ORAL
  Filled 2023-08-16: qty 1

## 2023-08-16 MED ORDER — BSS IO SOLN
15.0000 mL | Freq: Once | INTRAOCULAR | Status: AC
  Administered 2023-08-16: 15 mL

## 2023-08-16 MED ORDER — ONDANSETRON HCL 4 MG PO TABS: 4.0000 mg | ORAL_TABLET | Freq: Four times a day (QID) | ORAL | Status: DC | PRN

## 2023-08-16 MED ORDER — 0.9 % SODIUM CHLORIDE (POUR BTL) OPTIME
TOPICAL | Status: DC | PRN
  Administered 2023-08-16: 1000 mL

## 2023-08-16 MED ORDER — THROMBIN 5000 UNITS EX SOLR
CUTANEOUS | Status: AC
  Filled 2023-08-16: qty 15000

## 2023-08-16 MED ORDER — KETOROLAC TROMETHAMINE 0.5 % OP SOLN: 1.0000 [drp] | Freq: Four times a day (QID) | OPHTHALMIC | Status: DC

## 2023-08-16 MED ORDER — SENNA 8.6 MG PO TABS
1.0000 | ORAL_TABLET | Freq: Two times a day (BID) | ORAL | Status: DC
  Administered 2023-08-16 – 2023-08-19 (×6): 8.6 mg via ORAL
  Filled 2023-08-16 (×6): qty 1

## 2023-08-16 MED ORDER — ROCURONIUM BROMIDE 10 MG/ML (PF) SYRINGE
PREFILLED_SYRINGE | INTRAVENOUS | Status: DC | PRN
  Administered 2023-08-16: 20 mg via INTRAVENOUS
  Administered 2023-08-16: 100 mg via INTRAVENOUS

## 2023-08-16 MED ORDER — HEPARIN SODIUM (PORCINE) 5000 UNIT/ML IJ SOLN
5000.0000 [IU] | Freq: Three times a day (TID) | INTRAMUSCULAR | Status: DC
  Administered 2023-08-16 – 2023-08-19 (×8): 5000 [IU] via SUBCUTANEOUS
  Filled 2023-08-16 (×8): qty 1

## 2023-08-16 MED ORDER — OXYCODONE HCL 5 MG PO TABS: 5.0000 mg | ORAL_TABLET | Freq: Once | ORAL | Status: DC | PRN

## 2023-08-16 MED ORDER — KETOROLAC TROMETHAMINE 0.5 % OP SOLN
OPHTHALMIC | Status: AC
  Filled 2023-08-16: qty 5

## 2023-08-16 MED ORDER — SODIUM CHLORIDE 0.9 % IV SOLN
250.0000 mL | INTRAVENOUS | Status: DC
  Administered 2023-08-17: 250 mL via INTRAVENOUS

## 2023-08-16 MED ORDER — ONDANSETRON HCL 4 MG/2ML IJ SOLN: 4.0000 mg | Freq: Once | INTRAMUSCULAR | Status: DC | PRN

## 2023-08-16 MED ORDER — LIDOCAINE 2% (20 MG/ML) 5 ML SYRINGE
INTRAMUSCULAR | Status: DC | PRN
  Administered 2023-08-16: 100 mg via INTRAVENOUS

## 2023-08-16 MED ORDER — OXYCODONE HCL ER 15 MG PO T12A
15.0000 mg | EXTENDED_RELEASE_TABLET | Freq: Two times a day (BID) | ORAL | Status: DC
  Administered 2023-08-16 – 2023-08-19 (×7): 15 mg via ORAL
  Filled 2023-08-16 (×7): qty 1

## 2023-08-16 MED ORDER — POTASSIUM CHLORIDE IN NACL 20-0.9 MEQ/L-% IV SOLN
INTRAVENOUS | Status: DC
  Filled 2023-08-16: qty 1000

## 2023-08-16 MED ORDER — ONDANSETRON HCL 4 MG/2ML IJ SOLN
INTRAMUSCULAR | Status: AC
  Filled 2023-08-16: qty 4

## 2023-08-16 SURGICAL SUPPLY — 66 items
ADH SKN CLS APL DERMABOND .7 (GAUZE/BANDAGES/DRESSINGS) ×4
ADH SKN CLS LQ APL DERMABOND (GAUZE/BANDAGES/DRESSINGS) ×4
APL SKNCLS STERI-STRIP NONHPOA (GAUZE/BANDAGES/DRESSINGS)
BAG COUNTER SPONGE SURGICOUNT (BAG) ×4 IMPLANT
BAG SPNG CNTER NS LX DISP (BAG) ×2
BENZOIN TINCTURE PRP APPL 2/3 (GAUZE/BANDAGES/DRESSINGS) ×2 IMPLANT
BLADE CLIPPER SURG (BLADE) IMPLANT
BUR MATCHSTICK NEURO 3.0 LAGG (BURR) ×4 IMPLANT
BUR PRECISION FLUTE 5.0 (BURR) ×2 IMPLANT
CANISTER SUCT 3000ML PPV (MISCELLANEOUS) ×4 IMPLANT
CAP RELINE MOD TULIP RMM (Cap) IMPLANT
COVER BACK TABLE 60X90IN (DRAPES) IMPLANT
DERMABOND ADVANCED .7 DNX12 (GAUZE/BANDAGES/DRESSINGS) ×4 IMPLANT
DERMABOND ADVANCED .7 DNX6 (GAUZE/BANDAGES/DRESSINGS) IMPLANT
DRAPE C-ARMOR (DRAPES) IMPLANT
DRAPE LAPAROTOMY 100X72X124 (DRAPES) ×4 IMPLANT
DRAPE MICROSCOPE SLANT 54X150 (MISCELLANEOUS) ×4 IMPLANT
DRAPE SURG 17X23 STRL (DRAPES) ×10 IMPLANT
DRSG OPSITE POSTOP 4X6 (GAUZE/BANDAGES/DRESSINGS) IMPLANT
DURAPREP 26ML APPLICATOR (WOUND CARE) ×2 IMPLANT
ELECT BLADE 4.0 EZ CLEAN MEGAD (MISCELLANEOUS)
ELECT REM PT RETURN 9FT ADLT (ELECTROSURGICAL) ×2
ELECTRODE BLDE 4.0 EZ CLN MEGD (MISCELLANEOUS) ×2 IMPLANT
ELECTRODE REM PT RTRN 9FT ADLT (ELECTROSURGICAL) ×4 IMPLANT
GAUZE 4X4 16PLY ~~LOC~~+RFID DBL (SPONGE) IMPLANT
GAUZE SPONGE 4X4 12PLY STRL (GAUZE/BANDAGES/DRESSINGS) IMPLANT
GLOVE BIO SURGEON STRL SZ7 (GLOVE) IMPLANT
GLOVE BIOGEL PI IND STRL 7.5 (GLOVE) IMPLANT
GLOVE ECLIPSE 6.5 STRL STRAW (GLOVE) ×4 IMPLANT
GLOVE EXAM NITRILE XL STR (GLOVE) IMPLANT
GOWN STRL REUS W/ TWL LRG LVL3 (GOWN DISPOSABLE) ×4 IMPLANT
GOWN STRL REUS W/ TWL XL LVL3 (GOWN DISPOSABLE) ×2 IMPLANT
GOWN STRL REUS W/TWL 2XL LVL3 (GOWN DISPOSABLE) IMPLANT
GOWN STRL REUS W/TWL LRG LVL3 (GOWN DISPOSABLE) ×4
GOWN STRL REUS W/TWL XL LVL3 (GOWN DISPOSABLE)
KIT BASIN OR (CUSTOM PROCEDURE TRAY) ×4 IMPLANT
KIT TURNOVER KIT B (KITS) ×4 IMPLANT
NDL HYPO 21X1.5 SAFETY (NEEDLE) IMPLANT
NDL HYPO 22X1.5 SAFETY MO (MISCELLANEOUS) ×2 IMPLANT
NDL HYPO 25X1 1.5 SAFETY (NEEDLE) ×2 IMPLANT
NDL SPNL 18GX3.5 QUINCKE PK (NEEDLE) IMPLANT
NEEDLE HYPO 21X1.5 SAFETY (NEEDLE) IMPLANT
NEEDLE HYPO 22X1.5 SAFETY MO (MISCELLANEOUS) IMPLANT
NEEDLE HYPO 25X1 1.5 SAFETY (NEEDLE) ×2 IMPLANT
NEEDLE SPNL 18GX3.5 QUINCKE PK (NEEDLE) ×2 IMPLANT
NS IRRIG 1000ML POUR BTL (IV SOLUTION) ×4 IMPLANT
PACK LAMINECTOMY NEURO (CUSTOM PROCEDURE TRAY) ×4 IMPLANT
PAD ARMBOARD 7.5X6 YLW CONV (MISCELLANEOUS) ×12 IMPLANT
PATTIES SURGICAL .5 X1 (DISPOSABLE) IMPLANT
ROD RELINE O COCR 5.0X50MM (Rod) IMPLANT
SCREW LOCK RSS 4.5/5.0MM (Screw) IMPLANT
SCREW SHANK RELINE 6.5X40MM (Screw) IMPLANT
SCREW SHANK RELINE MOD 7.5X40 (Screw) IMPLANT
SPIKE FLUID TRANSFER (MISCELLANEOUS) ×2 IMPLANT
SPONGE SURGIFOAM ABS GEL SZ50 (HEMOSTASIS) ×4 IMPLANT
SPONGE T-LAP 4X18 ~~LOC~~+RFID (SPONGE) IMPLANT
STRIP CLOSURE SKIN 1/2X4 (GAUZE/BANDAGES/DRESSINGS) IMPLANT
SUT VIC AB 0 CT1 18XCR BRD8 (SUTURE) ×2 IMPLANT
SUT VIC AB 0 CT1 8-18 (SUTURE) ×4
SUT VIC AB 1 CT1 18XBRD ANBCTR (SUTURE) ×4 IMPLANT
SUT VIC AB 1 CT1 8-18 (SUTURE)
SUT VIC AB 2-0 CT1 18 (SUTURE) ×2 IMPLANT
SUT VIC AB 3-0 SH 8-18 (SUTURE) ×6 IMPLANT
TOWEL GREEN STERILE (TOWEL DISPOSABLE) ×4 IMPLANT
TOWEL GREEN STERILE FF (TOWEL DISPOSABLE) ×4 IMPLANT
WATER STERILE IRR 1000ML POUR (IV SOLUTION) ×4 IMPLANT

## 2023-08-16 NOTE — Anesthesia Procedure Notes (Signed)
Procedure Name: Intubation Date/Time: 08/16/2023 9:26 AM  Performed by: Darryl Nestle, CRNAPre-anesthesia Checklist: Patient identified, Emergency Drugs available, Suction available and Patient being monitored Patient Re-evaluated:Patient Re-evaluated prior to induction Oxygen Delivery Method: Circle system utilized Preoxygenation: Pre-oxygenation with 100% oxygen Induction Type: IV induction Ventilation: Mask ventilation without difficulty Laryngoscope Size: Mac and 3 Grade View: Grade I Tube type: Oral Tube size: 7.0 mm Number of attempts: 1 Airway Equipment and Method: Stylet and Oral airway Placement Confirmation: ETT inserted through vocal cords under direct vision, positive ETCO2 and breath sounds checked- equal and bilateral Secured at: 23 cm Tube secured with: Tape Dental Injury: Teeth and Oropharynx as per pre-operative assessment

## 2023-08-16 NOTE — Anesthesia Postprocedure Evaluation (Signed)
Anesthesia Post Note  Patient: Quoc Kaseman  Procedure(s) Performed: THORACIC TEN-THORACIC ELEVEN discetomy, decompression and Fusion (Back) Lumbar Three-Lumbar Four right discetomy and decompression (Right: Back)     Patient location during evaluation: PACU Anesthesia Type: General Level of consciousness: awake and alert Pain management: pain level controlled Vital Signs Assessment: post-procedure vital signs reviewed and stable Respiratory status: spontaneous breathing, nonlabored ventilation, respiratory function stable and patient connected to nasal cannula oxygen Cardiovascular status: blood pressure returned to baseline and stable Anesthetic complications: yes (Likely right eye corneal abrasion. Antibiotic and analgesic drops ordered.)   No notable events documented.  Last Vitals:  Vitals:   08/16/23 1500 08/16/23 1515  BP: 110/87 131/67  Pulse: 66 60  Resp: 11 11  Temp:    SpO2: 94% 94%    Last Pain:  Vitals:   08/16/23 1438  TempSrc:   PainSc: Asleep                 Mariann Barter

## 2023-08-16 NOTE — Progress Notes (Signed)
Pt has been complaining of eye pain. Redness noted to bilateral eyes, saline flushed. Right eye now causing severe burning per pt. Increased redness noted. Dr. Ace Gins made aware, new orders for eye drops.

## 2023-08-16 NOTE — Transfer of Care (Signed)
Immediate Anesthesia Transfer of Care Note  Patient: Tyler Ferguson  Procedure(s) Performed: THORACIC TEN-THORACIC ELEVEN discetomy, decompression and Fusion (Back) Lumbar Three-Lumbar Four right discetomy and decompression (Right: Back)  Patient Location: PACU  Anesthesia Type:General  Level of Consciousness: awake and alert   Airway & Oxygen Therapy: Patient Spontanous Breathing  Post-op Assessment: Report given to RN and Post -op Vital signs reviewed and stable  Post vital signs: Reviewed and stable  Last Vitals:  Vitals Value Taken Time  BP 135/71 08/16/23 1445  Temp 98   Pulse 67 08/16/23 1448  Resp 14 08/16/23 1448  SpO2 95 % 08/16/23 1448  Vitals shown include unfiled device data.  Last Pain:  Vitals:   08/16/23 0730  TempSrc: Oral  PainSc: 0-No pain         Complications: No notable events documented.

## 2023-08-16 NOTE — Interval H&P Note (Signed)
History and Physical Interval Note:  08/16/2023 9:05 AM  Tyler Ferguson  has presented today for surgery, with the diagnosis of HNP.  The various methods of treatment have been discussed with the patient and family. After consideration of risks, benefits and other options for treatment, the patient has consented to  Procedure(s): T10-T11 discetomy, decompression (N/A) L3-L4 right discetomy. possible thoracic fusion (Right) as a surgical intervention.  The patient's history has been reviewed, patient examined, no change in status, stable for surgery.  I have reviewed the patient's chart and labs.  Questions were answered to the patient's satisfaction.     Coletta Memos

## 2023-08-16 NOTE — Progress Notes (Signed)
Notified Dr. Ace Gins of pt's pulse rate 48-49. Pt. Denies sob or chest pain. Placed on heart monitor, sinus brady. No new orders, continue to monitor.

## 2023-08-17 DIAGNOSIS — M5186 Other intervertebral disc disorders, lumbar region: Secondary | ICD-10-CM | POA: Diagnosis not present

## 2023-08-17 NOTE — Evaluation (Signed)
Physical Therapy Evaluation Patient Details Name: Tyler Ferguson MRN: [id number] DOB: [birth year] Today's Date: 08/17/2023  History of Present Illness  57 y.o. male s/p T10-T11 discetomy, decompression and L3-L4 right discetomy. possible thoracic fusion (Right) as a surgical intervention on 08/16/23.  Clinical Impression   Admitted with above diagnosis; prior to admit, pt was living primarily alone Mod I - independent with all ADL tasks and functional mobility. Pt utilized SPC and Right knee brace, leading up to surgery; Before that, no difficulty walking, and no assistive device needed; Pt reports he has not been able to work since June.  Presents to PT with uncoordinated gait, incr fall risk, functional decline compare to baseline; Pt will benefit from acute skilled PT to increase their safety and independence with ADL and functional mobility to facilitate discharge. Recommend intensive inpatient follow up therapy, >3 hours/day. Pt is very motivated to return to independence; Fiance present and states that she will be able to provide 24/7 supervision/assist after pt completes some form of intensive inpatient rehab versus discharging straight home with home Health services.         If plan is discharge home, recommend the following: A little help with walking and/or transfers;Assistance with cooking/housework;A little help with bathing/dressing/bathroom   Can travel by private vehicle        Equipment Recommendations Rolling walker (2 wheels);BSC/3in1  Recommendations for Other Services  Rehab consult (Very motivated, and quite a decline from normal gait without any assistive device)    Functional Status Assessment Patient has had a recent decline in their functional status and demonstrates the ability to make significant improvements in function in a reasonable and predictable amount of time.     Precautions / Restrictions Precautions Precautions: Back Precaution Booklet Issued: Yes  (comment) Precaution Comments: No back brace needed per MD. Pt and Significant other provided with verbal education regarding back precautions. Handout provided for reference.      Mobility  Bed Mobility Overal bed mobility: Needs Assistance Bed Mobility: Rolling, Sidelying to Sit Rolling: Min assist Sidelying to sit: Min assist       General bed mobility comments: HOB flat with bed rails down. Assist to help B legs off EOB bed. VC for back precautions, sequencing, and safety.    Transfers Overall transfer level: Needs assistance Equipment used: Rolling walker (2 wheels) Transfers: Sit to/from Stand Sit to Stand: Min assist           General transfer comment: VC provided for hand placement with RW management and back precautions; Min assist to steady RW    Ambulation/Gait Ambulation/Gait assistance: Min assist, Contact guard assist Gait Distance (Feet): 40 Feet (Greater than, including getting into/out of bathroom) Assistive device: Rolling walker (2 wheels) Gait Pattern/deviations: Narrow base of support, Knee hyperextension - right, Ataxic Gait velocity: Slowed     General Gait Details: Noting RLE tending to overshoot step length leading to heavy heel strike with knee fully extended and pushing into hyperextension occasionally; Erratic step width, tending to be too narrow  Careers information officer     Tilt Bed    Modified Rankin (Stroke Patients Only)       Balance     Sitting balance-Leahy Scale: Good       Standing balance-Leahy Scale: Fair  Pertinent Vitals/Pain Pain Assessment Pain Assessment: Faces Faces Pain Scale: Hurts whole lot Pain Location: Back/surgical incision Pain Descriptors / Indicators: Aching, Discomfort, Grimacing, Guarding Pain Intervention(s): Monitored during session    Home Living Family/patient expects to be discharged to:: Private residence Living  Arrangements: Alone;Spouse/significant other (Fiance right now is able to be there 2-3X a week. In a few weeks can be there 24/7.) Available Help at Discharge: Family;Available 24 hours/day;Available PRN/intermittently;Friend(s) Type of Home: House Home Access: Stairs to enter Entrance Stairs-Rails: Right (outside entrance: railing on Right only. Garage/basement entrance: B railing and can reach both) Entrance Stairs-Number of Steps: Front entrance: 6 short gradual steps with ~2-3 feet platform. Garage entrance into basement with full flight into home.   Home Layout: Two level;Full bath on main level Home Equipment: Standard Walker;Cane - single point;Toilet riser;Shower seat;Other (comment) (adjustable bed)      Prior Function Prior Level of Function : Independent/Modified Independent;Driving;Working/employed             Mobility Comments: Uses SPC and right knee brace ADLs Comments: Has not worked since June 2024. [employer] and delivers furniture. Independent with ADL tasks.     Extremity/Trunk Assessment   Upper Extremity Assessment Upper Extremity Assessment: Defer to OT evaluation    Lower Extremity Assessment Lower Extremity Assessment: Generalized weakness;RLE deficits/detail;LLE deficits/detail RLE Deficits / Details: Dyscoordinated stepping, including overshooting footfall leading to hellstriking onto a fully extended knee; erratic step width LLE Deficits / Details: Dyscoordinated stepping (slightly better than RLE)    Cervical / Trunk Assessment Cervical / Trunk Assessment: Back Surgery  Communication   Communication Communication: No apparent difficulties  Cognition Arousal: Alert Behavior During Therapy: WFL for tasks assessed/performed Overall Cognitive Status: Within Functional Limits for tasks assessed                                          General Comments General comments (skin integrity, edema, etc.): Fiance, Trudy, present at beginning  of session and helpful    Exercises     Assessment/Plan    PT Assessment Patient needs continued PT services  PT Problem List Decreased strength;Decreased activity tolerance;Decreased balance;Decreased mobility;Decreased coordination;Decreased knowledge of use of DME;Decreased safety awareness;Decreased knowledge of precautions;Impaired sensation;Impaired tone;Decreased skin integrity;Pain       PT Treatment Interventions DME instruction;Gait training;Stair training;Functional mobility training;Therapeutic activities;Therapeutic exercise;Balance training;Neuromuscular re-education;Cognitive remediation;Patient/family education;Wheelchair mobility training    PT Goals (Current goals can be found in the Care Plan section)  Acute Rehab PT Goals Patient Stated Goal: Wants to be safe and confident in mobiltiy adn ADLs to return home PT Goal Formulation: With patient Time For Goal Achievement: 08/31/23 Potential to Achieve Goals: Good    Frequency Min 1X/week     Co-evaluation               AM-PAC PT "6 Clicks" Mobility  Outcome Measure Help needed turning from your back to your side while in a flat bed without using bedrails?: A Little Help needed moving from lying on your back to sitting on the side of a flat bed without using bedrails?: A Little Help needed moving to and from a bed to a chair (including a wheelchair)?: A Little Help needed standing up from a chair using your arms (e.g., wheelchair or bedside chair)?: A Lot Help needed to walk in hospital room?: A Lot Help needed climbing 3-5 steps with a railing? :  A Lot 6 Click Score: 15    End of Session Equipment Utilized During Treatment: Gait belt;Other (comment) (with careful placement) Activity Tolerance: Patient tolerated treatment well Patient left: in chair;with call bell/phone within reach Nurse Communication: Mobility status;Other (comment) (adn pt voided) PT Visit Diagnosis: Unsteadiness on feet  (R26.81);Other abnormalities of gait and mobility (R26.89);Muscle weakness (generalized) (M62.81);Ataxic gait (R26.0)    Time: 0981-1914 PT Time Calculation (min) (ACUTE ONLY): 25 min   Charges:   PT Evaluation $PT Eval Moderate Complexity: 1 Mod PT Treatments $Gait Training: 8-22 mins PT General Charges $$ ACUTE PT VISIT: 1 Visit         Van Clines, PT  Acute Rehabilitation Services Office [phone] Secure Chat welcomed   Levi Aland 08/17/2023, 3:04 PM

## 2023-08-17 NOTE — Plan of Care (Signed)
  Problem: Activity: Goal: Risk for activity intolerance will decrease Outcome: Progressing   Problem: Nutrition: Goal: Adequate nutrition will be maintained Outcome: Progressing   Problem: Pain Managment: Goal: General experience of comfort will improve Outcome: Progressing   Problem: Safety: Goal: Ability to remain free from injury will improve Outcome: Progressing   

## 2023-08-17 NOTE — Evaluation (Signed)
Occupational Therapy Evaluation Patient Details Name: Tyler Ferguson MRN: [id number] DOB: [birth year] Today's Date: 08/17/2023   History of Present Illness 57 y.o. male s/p T10-T11 discetomy, decompression and L3-L4 right discetomy. possible thoracic fusion (Right) as a surgical intervention on 08/16/23.   Clinical Impression   Pt admitted with the above diagnosis. Pt currently with functional limitations due to the deficits listed below (see OT Problem List). Prior to admit, pt was living primarily alone Mod I - independent with all ADL tasks and functional mobility. Pt utilized SPC and Right knee brace. Pt reports he has not been able to work since June.  Pt will benefit from acute skilled OT to increase their safety and independence with ADL and functional mobility for ADL to facilitate discharge. Patient will benefit from intensive inpatient follow up therapy, >3 hours/day. Fiance present and states that she will be able to provide 24/7 supervision/assist after pt completes some form of intensive inpatient rehab versus discharging straight home with home Health services. OT will continue to follow patient acutely.          If plan is discharge home, recommend the following: A little help with walking and/or transfers;A little help with bathing/dressing/bathroom;Help with stairs or ramp for entrance;Assistance with cooking/housework;Assist for transportation    Functional Status Assessment  Patient has had a recent decline in their functional status and demonstrates the ability to make significant improvements in function in a reasonable and predictable amount of time.  Equipment Recommendations  Other (comment) (RW)    Recommendations for Other Services PT consult;Rehab consult     Precautions / Restrictions Precautions Precautions: Back Precaution Booklet Issued: Yes (comment) Precaution Comments: No back brace needed per MD. Pt and Significant other provided with verbal education  regarding back precautions. Handout provided for reference. Restrictions Weight Bearing Restrictions: No      Mobility Bed Mobility Overal bed mobility: Needs Assistance Bed Mobility: Sit to Sidelying         Sit to sidelying: Min assist General bed mobility comments: HOB flat with bed rails down. Assist to bring B legs back onto bed. VC for back precautions, sequencing, and safety.    Transfers Overall transfer level: Needs assistance Equipment used: Rolling walker (2 wheels) Transfers: Sit to/from Stand, Bed to chair/wheelchair/BSC Sit to Stand: Contact guard assist     Step pivot transfers: Contact guard assist     General transfer comment: VC provided for hand placement with RW management and back precautions.      Balance Overall balance assessment: Needs assistance Sitting-balance support: Bilateral upper extremity supported, Feet supported Sitting balance-Leahy Scale: Good Sitting balance - Comments: sitting in recliner during sock management   Standing balance support: Bilateral upper extremity supported, During functional activity, Reliant on assistive device for balance Standing balance-Leahy Scale: Fair       ADL either performed or assessed with clinical judgement   ADL Overall ADL's : Needs assistance/impaired Eating/Feeding: Independent;Bed level   Grooming: Wash/dry hands;Wash/dry face;Oral care;Contact guard assist;Standing   Upper Body Bathing: Set up;Sitting;Cueing for compensatory techniques   Lower Body Bathing: Minimal assistance;Sit to/from stand;Cueing for back precautions   Upper Body Dressing : Minimal assistance;Sitting   Lower Body Dressing: Minimal assistance;Sit to/from stand;Cueing for back precautions;Adhering to back precautions   Toilet Transfer: Contact guard assist;BSC/3in1;Cueing for safety;Cueing for sequencing;Ambulation;Rolling walker (2 wheels)   Toileting- Clothing Manipulation and Hygiene: Moderate assistance;Sit  to/from stand;Cueing for back precautions  Vision Baseline Vision/History: 0 No visual deficits Ability to See in Adequate Light: 0 Adequate Patient Visual Report: No change from baseline       Perception Perception: Within Functional Limits       Praxis Praxis: WFL       Pertinent Vitals/Pain Pain Assessment Pain Assessment: Faces Faces Pain Scale: Hurts whole lot Pain Location: Back/surgical incision Pain Descriptors / Indicators: Aching, Discomfort, Grimacing, Guarding Pain Intervention(s): Limited activity within patient's tolerance, Monitored during session, Repositioned, Patient requesting pain meds-RN notified, RN gave pain meds during session, Relaxation     Extremity/Trunk Assessment Upper Extremity Assessment Upper Extremity Assessment: Overall WFL for tasks assessed;Right hand dominant   Lower Extremity Assessment Lower Extremity Assessment: Defer to PT evaluation;Generalized weakness (Reports BLE numbness)   Cervical / Trunk Assessment Cervical / Trunk Assessment: Back Surgery   Communication Communication Communication: No apparent difficulties   Cognition Arousal: Alert Behavior During Therapy: WFL for tasks assessed/performed Overall Cognitive Status: Within Functional Limits for tasks assessed        General Comments  VSS on RA. Surgical dressing in place and seecure. No drainage.            Home Living Family/patient expects to be discharged to:: Private residence Living Arrangements: Alone;Spouse/significant other (Fiance right now is able to be there 2-3X a week. In a few weeks can be there 24/7.) Available Help at Discharge: Family;Available 24 hours/day;Available PRN/intermittently;Friend(s) Type of Home: House Home Access: Stairs to enter [employer] of Steps: Front entrance: 6 short gradual steps with ~2-3 feet platform. Garage entrance into basement with full flight into home. Entrance Stairs-Rails: Right  (outside entrance: railing on Right only. Garage/basement entrance: B railing and can reach both) Home Layout: Two level;Full bath on main level     Bathroom Shower/Tub: Chief Strategy Officer: Standard     Home Equipment: Standard Walker;Cane - single point;Toilet riser;Shower seat;Other (comment) (adjustable bed)          Prior Functioning/Environment Prior Level of Function : Independent/Modified Independent;Driving;Working/employed             Mobility Comments: Uses SPC and right knee brace ADLs Comments: Has not worked since June 2024. [employer] and delivers furniture. Independent with ADL tasks.        OT Problem List: Decreased strength;Pain;Impaired sensation;Decreased activity tolerance;Impaired balance (sitting and/or standing);Decreased knowledge of use of DME or AE;Decreased knowledge of precautions      OT Treatment/Interventions: Self-care/ADL training;Therapeutic exercise;Therapeutic activities;Energy conservation;DME and/or AE instruction;Patient/family education;Balance training;Manual therapy    OT Goals(Current goals can be found in the care plan section) Acute Rehab OT Goals Patient Stated Goal: to lay down OT Goal Formulation: Patient unable to participate in goal setting Time For Goal Achievement: 08/31/23 Potential to Achieve Goals: Good  OT Frequency: Min 1X/week       AM-PAC OT "6 Clicks" Daily Activity     Outcome Measure Help from another person eating meals?: None Help from another person taking care of personal grooming?: A Little Help from another person toileting, which includes using toliet, bedpan, or urinal?: A Lot Help from another person bathing (including washing, rinsing, drying)?: A Little Help from another person to put on and taking off regular upper body clothing?: A Little Help from another person to put on and taking off regular lower body clothing?: A Little 6 Click Score: 18   End of Session Equipment Utilized  During Treatment: Gait belt;Rolling walker (2 wheels) Nurse Communication: Mobility status;Patient requests pain  meds  Activity Tolerance: Patient tolerated treatment well;Patient limited by pain Patient left: in bed;with bed alarm set;with call bell/phone within reach;with family/visitor present  OT Visit Diagnosis: Unsteadiness on feet (R26.81);Muscle weakness (generalized) (M62.81);Pain Pain - part of body:  (low back)                Time: 1610-9604 OT Time Calculation (min): 42 min Charges:  OT General Charges $OT Visit: 1 Visit OT Evaluation $OT Eval Moderate Complexity: 1 Mod OT Treatments $Self Care/Home Management : 23-37 mins  Limmie Patricia, OTR/L,CBIS  Supplemental OT - [facility] and [facility] Secure Chat Preferred    Leanette Eutsler, Charisse March 08/17/2023, 11:03 AM

## 2023-08-17 NOTE — Progress Notes (Signed)
NEUROSURGERY PROGRESS NOTE  Postop day 1. He's doing really well. Back pain is manageable. Will work on ambulating the halls today. D/c foley. Incision CDI  Temp:  [97.6 F (36.4 C)-99 F (37.2 C)] 98.2 F (36.8 C) (09/08 0630) Pulse Rate:  [54-81] 60 (09/08 0630) Resp:  [11-20] 18 (09/08 0630) BP: (109-157)/(52-87) 113/66 (09/08 0630) SpO2:  [91 %-99 %] 98 % (09/08 0630)    Sherryl Manges, NP 08/17/2023 9:06 AM

## 2023-08-18 ENCOUNTER — Encounter (HOSPITAL_COMMUNITY): Payer: Self-pay | Admitting: Neurosurgery

## 2023-08-18 DIAGNOSIS — M5186 Other intervertebral disc disorders, lumbar region: Secondary | ICD-10-CM | POA: Diagnosis not present

## 2023-08-18 MED ORDER — TIZANIDINE HCL 4 MG PO TABS: 4.0000 mg | ORAL_TABLET | Freq: Four times a day (QID) | ORAL | 0 refills | Status: DC | PRN

## 2023-08-18 MED ORDER — OXYCODONE HCL 10 MG PO TABS: 5.0000 mg | ORAL_TABLET | Freq: Four times a day (QID) | ORAL | 0 refills | Status: AC | PRN

## 2023-08-18 MED ORDER — MAGNESIUM CITRATE PO SOLN
1.0000 | ORAL | Status: AC
  Administered 2023-08-18: 1 via ORAL
  Filled 2023-08-18: qty 296

## 2023-08-18 NOTE — Discharge Summary (Signed)
  BP (!) 103/54 (BP Location: Right Arm)   Pulse 67   Temp 98 F (36.7 C)   Resp 18   SpO2 96%  Physician Discharge Summary  Patient ID: Tyler Ferguson MRN: [id number] DOB/AGE: 59-19-1967 59 y.o.  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 He also was myelopathic due to severe stenosis at T10/11 due to calcified ligamentum, and an HNP on the left. Post op he is ambulating, and was recommended for home health PT. His wound is clean, dry, no signs of infection.  Treatments: surgery: discetomy right L3/4, T10, 11 hemilaminectomy for decompression and discetomy   Discharge Exam: Blood pressure (!) 103/54, pulse 67, temperature 98 F (36.7 C), resp. rate 18, SpO2 96%. General appearance: alert, cooperative, appears stated age, and mild distress  Disposition: Discharge disposition: 01-Home or Self Care      Herniated Nucleus Pulposus Discharge Instructions     Face-to-face encounter (required for Medicare/Medicaid patients)   Complete by: As directed    I Tyler Ferguson certify that this patient is under my care and that I, or a nurse practitioner or physician's assistant working with me, had a face-to-face encounter that meets the physician face-to-face encounter requirements with this patient on 08/18/2023. The encounter with the patient was in whole, or in part for the following medical condition(s) which is the primary reason for home health care (List medical condition): thoracic myelopathy   The encounter with the patient was in whole, or in part, for the following medical condition, which is the primary reason for home health care: thoracic stenosis   I  certify that, based on my findings, the following services are medically necessary home health services: Physical therapy   Reason for Medically Necessary Home Health Services: Therapy- Therapeutic Exercises to Increase Strength and Endurance   My clinical findings support the need for the above services: Unable to leave home safely without assistance and/or assistive device   Further, I certify that my clinical findings support that this patient is homebound due to: Unable to leave home safely without assistance   Home Health   Complete by: As directed    To provide the following care/treatments:  PT OT     Incentive spirometry RT   Complete by: As directed       Allergies as of 08/18/2023   No Known Allergies      Medication List     TAKE these medications    Oxycodone HCl 10 MG Tabs Take 0.5-1 tablets (5-10 mg total) by mouth every 6 (six) hours as needed for up to 8 days for severe pain ((score 7 to 10)).   sildenafil 100 MG tablet Commonly known as: VIAGRA TAKE 1/2 TO 1 (ONE-HALF TO ONE) TABLET BY MOUTH ONCE DAILY AS NEEDED FOR ERECTILE DYSFUNCTION   Testosterone Cypionate 100 MG/ML Soln Inject 1 1/2 cc into muscle every 14 days.   tiZANidine 4 MG tablet Commonly known as: ZANAFLEX Take 1 tablet (4 mg total) by mouth every 6 (six) hours as needed for muscle spasms.         SignedColetta Ferguson 08/18/2023, 6:49 PM

## 2023-08-18 NOTE — Progress Notes (Addendum)
Physical Therapy Treatment Patient Details Name: Tyler Ferguson MRN: [id number] DOB: [birth year] Today's Date: 08/18/2023   History of Present Illness 57 y.o. male s/p T10-T11 discetomy, decompression and L3-L4 right discetomy. possible thoracic fusion (Right) as a surgical intervention on 08/16/23.    PT Comments  Pt received in recliner, agreeable to therapy session and with good participation and tolerance for transfer, gait and stair training with cane. Pt needing up to CGA for safety to perform higher level balance tasks. Pt scored 17/24 on Dynamic Gait Index. Scores of 19 or less are predictive of falls in older community living adults, he would benefit from increased PRN supervision/assist at home. Pt able to recall 3/3 back precautions. Pt would benefit from reviewing bed mobility log rolling next session to reinforce. Pt continues to benefit from PT services to progress toward functional mobility goals.    If plan is discharge home, recommend the following: A little help with walking and/or transfers;Assistance with cooking/housework;A little help with bathing/dressing/bathroom   Can travel by private vehicle        Equipment Recommendations  Rolling walker (2 wheels);BSC/3in1    Recommendations for Other Services       Precautions / Restrictions Precautions Precautions: Back Precaution Booklet Issued: Yes (comment) Precaution Comments: No back brace needed per MD. Pt and significant other have been educated on back precautions. Handout in room. Restrictions Weight Bearing Restrictions: No     Mobility  Bed Mobility Overal bed mobility: Needs Assistance             General bed mobility comments: Verbal and visual demo with log roll technique, pt reports understanding, defers to perform as he is wanting to rest on ice pack in recliner. Pt agreeable to attempt next session.    Transfers Overall transfer level: Needs assistance Equipment used: Straight cane Transfers:  Sit to/from Stand Sit to Stand: Supervision           General transfer comment: from chair using cane, Supervision for technique with cane to avoid bending past 90*, visual demo prior    Ambulation/Gait Ambulation/Gait assistance: Contact guard assist, Supervision Gait Distance (Feet): 350 Feet Assistive device: Straight cane Gait Pattern/deviations: Narrow base of support, Ataxic, Drifts right/left       General Gait Details: Noting RLE tending to overshoot step length leading to heavy heel strike with knee fully extended, cues for tightening R quad to avoid hyperextension with improved stability this date. Cues for slightly wider step width; see DGI.   Stairs Stairs: Yes Stairs assistance: Contact guard assist, Supervision Stair Management: One rail Left, Step to pattern, Forwards, With cane Number of Stairs: 10 General stair comments: Cues for initial sequencing, pt tried ascending with RLE and LLE but steadier ascending with LLE and descending with RLE, PTA recommended he continue with that pattern, pt receptive.   Wheelchair Mobility     Tilt Bed    Modified Rankin (Stroke Patients Only)       Balance Overall balance assessment: Mild deficits observed, not formally tested (see DGI)                               Standardized Balance Assessment Standardized Balance Assessment : Dynamic Gait Index   Dynamic Gait Index Level Surface: Mild Impairment Change in Gait Speed: Mild Impairment Gait with Horizontal Head Turns: Mild Impairment Gait with Vertical Head Turns: Normal Gait and Pivot Turn: Mild Impairment Step Over Obstacle: Mild Impairment  Step Around Obstacles: Normal Steps: Moderate Impairment Total Score: 17      Cognition Arousal: Alert Behavior During Therapy: WFL for tasks assessed/performed, Anxious Overall Cognitive Status: Within Functional Limits for tasks assessed                                 General  Comments: Anxious regarding prospect of home with less support than he needs. Pt very motivated and cooperative and following 1 and 2-step commands well.        Exercises      General Comments General comments (skin integrity, edema, etc.): No acute s/sx distress.      Pertinent Vitals/Pain Pain Assessment Pain Assessment: Faces Faces Pain Scale: Hurts little more Pain Location: Back/surgical incision Pain Descriptors / Indicators: Grimacing, Discomfort, Aching Pain Intervention(s): Limited activity within patient's tolerance, Monitored during session     PT Goals (current goals can now be found in the care plan section) Acute Rehab PT Goals PT Goal Formulation: With patient Time For Goal Achievement: 08/31/23 Progress towards PT goals: Progressing toward goals    Frequency    Min 1X/week      PT Plan         AM-PAC PT "6 Clicks" Mobility   Outcome Measure  Help needed turning from your back to your side while in a flat bed without using bedrails?: A Little Help needed moving from lying on your back to sitting on the side of a flat bed without using bedrails?: A Little Help needed moving to and from a bed to a chair (including a wheelchair)?: None (with RW) Help needed standing up from a chair using your arms (e.g., wheelchair or bedside chair)?: A Little Help needed to walk in hospital room?: A Little Help needed climbing 3-5 steps with a railing? : A Little 6 Click Score: 19    End of Session Equipment Utilized During Treatment: Gait belt Activity Tolerance: Patient tolerated treatment well Patient left: in chair;with call bell/phone within reach (pt agreeable to use call bell for OOB) Nurse Communication: Mobility status PT Visit Diagnosis: Unsteadiness on feet (R26.81);Other abnormalities of gait and mobility (R26.89);Muscle weakness (generalized) (M62.81);Ataxic gait (R26.0)     Time: 7829-5621 PT Time Calculation (min) (ACUTE ONLY): 22 min  Charges:     $Gait Training: 8-22 mins PT General Charges $$ ACUTE PT VISIT: 1 Visit                     Naavya Postma P., PTA Acute Rehabilitation Services Secure Chat Preferred 9a-5:30pm Office: [phone]    Dorathy Kinsman [facility] 08/18/2023, 2:09 PM

## 2023-08-18 NOTE — Progress Notes (Addendum)
Inpatient Rehab Admissions Coordinator:    Per PT/OT Pt demonstrated progress with sessions today and they are now recommending Home Health. He is independent (with use of an assistive device) with transfers and ADLs and does not require 3 hours of daily therapy per day. As a result , he no longer is a candidate for CIR. TOC will need to set Pt up with [facility] therapies.    Megan Salon, MS, CCC-SLP Rehab Admissions Coordinator  [phone] (celll) [phone] (office)

## 2023-08-18 NOTE — Progress Notes (Signed)
Occupational Therapy Treatment and discharge Patient Details Name: Tyler Ferguson MRN: [id number] DOB: [birth year] Today's Date: 08/18/2023   History of present illness 57 y.o. male s/p T10-T11 discetomy, decompression and L3-L4 right discetomy. possible thoracic fusion (Right) as a surgical intervention on 08/16/23.   OT comments  Pt walking in room with RW Mod I upon therapy arrival. Fiance present during session. Pt demonstrated overall progress since initial evaluation. Education provided on use of reacher and sock aid to increase functional performance during LB dressing. Recommended use of long handled sponge in shower when bathing. Pt education provided on safe and efficient transfer technique to step in and out of tub/shower using shower chair. Pt was able to demonstrate understanding. All acute OT goals have been met at this time. All questions answered and education complete. D/C recommendation updated for home health OT services to focus on increasing pt's independence in the home setting when completing ADL and IADL tasks. OT signing off.       If plan is discharge home, recommend the following:  A little help with bathing/dressing/bathroom;Assistance with cooking/housework;Assist for transportation;Help with stairs or ramp for entrance   Equipment Recommendations  Other (comment) (RW, reacher, sock aid)       Precautions / Restrictions Precautions Precautions: Back Precaution Booklet Issued: No Precaution Comments: No back brace needed per MD. Pt and significant other have been educated on back precautions. Handout provided at evaluation. Restrictions Weight Bearing Restrictions: No       Mobility   Transfers Overall transfer level: Modified independent Equipment used: Rolling walker (2 wheels)        Balance Overall balance assessment: Mild deficits observed, not formally tested       ADL either performed or assessed with clinical judgement   ADL Overall ADL's :  Modified independent     Grooming: Oral care;Modified independent;Standing       Lower Body Bathing: Modified independent;Sitting/lateral leans;Sit to/from stand;Adhering to back precautions;With adaptive equipment Lower Body Bathing Details (indicate cue type and reason): simulated     Lower Body Dressing: Modified independent;Adhering to back precautions;Sitting/lateral leans;Sit to/from stand;With adaptive equipment Lower Body Dressing Details (indicate cue type and reason): Education provided initially with VC and visual demonstration for technique. Pt was able to return demonstration and verbalize understanding. Significant other purchased reacher and sock aid online during session. Toilet Transfer: Modified Independent;Ambulation;Rolling walker (2 wheels)       Tub/ Shower Transfer: Tub transfer;Supervision/safety;Adhering to back precautions;Shower seat;Ambulation;Rolling walker (2 wheels) Tub/Shower Transfer Details (indicate cue type and reason): Provided verbal education and visual demonstration initially. Pt able to verbalize understanding and return demonstration without difficulty. Significant other purchased suction cup grab bars online. Education provided to remove and place grab bar prior to each use to make sure the suction is sufficiant. Functional mobility during ADLs: Modified independent;Rolling walker (2 wheels)        Cognition Arousal: Alert Behavior During Therapy: WFL for tasks assessed/performed Overall Cognitive Status: Within Functional Limits for tasks assessed                 General Comments Fiance, Tyler Ferguson present during session.    Pertinent Vitals/ Pain       Pain Assessment Pain Assessment: Faces Faces Pain Scale: Hurts little more Pain Location: Back/surgical incision Pain Descriptors / Indicators: Grimacing, Discomfort, Aching Pain Intervention(s): Premedicated before session, Monitored during session         Frequency  Min 1X/week         Progress  Toward Goals  OT Goals(current goals can now be found in the care plan section)  Progress towards OT goals: Goals met/education completed, patient discharged from OT  ADL Goals Pt Will Perform Grooming:  (goal met 9/9) Pt Will Perform Lower Body Bathing:  (goal met 9/9 simulated) Pt Will Perform Lower Body Dressing:  (goal met 9/9) Pt Will Transfer to Toilet:  (goal met 9/9) Pt Will Perform Toileting - Clothing Manipulation and hygiene:  (goal met 9/9) Additional ADL Goal #1:  (goal met 9/9)  Plan         AM-PAC OT "6 Clicks" Daily Activity     Outcome Measure   Help from another person eating meals?: None Help from another person taking care of personal grooming?: None Help from another person toileting, which includes using toliet, bedpan, or urinal?: None Help from another person bathing (including washing, rinsing, drying)?: None Help from another person to put on and taking off regular upper body clothing?: None Help from another person to put on and taking off regular lower body clothing?: None 6 Click Score: 24    End of Session Equipment Utilized During Treatment: Gait belt;Rolling walker (2 wheels)  OT Visit Diagnosis: Unsteadiness on feet (R26.81);Muscle weakness (generalized) (M62.81);Pain Pain - part of body:  (back)   Activity Tolerance Patient tolerated treatment well   Patient Left Other (comment);with family/visitor present (in bathroom standing at sink with RW to brush teeth)           Time: 7829-5621 OT Time Calculation (min): 19 min  Charges: OT General Charges $OT Visit: 1 Visit OT Treatments $Self Care/Home Management : 8-22 mins  Tyler Ferguson, OTR/L,CBIS  Supplemental OT - [facility] and [facility] Secure Chat Preferred    Earon Rivest, Charisse March 08/18/2023, 12:49 PM

## 2023-08-18 NOTE — Progress Notes (Signed)
Inpatient Rehab Admissions Coordinator:  ? ?Per therapy recommendations,  patient was screened for CIR candidacy by Laura Staley, MS, CCC-SLP. At this time, Pt. Appears to be a a potential candidate for CIR. I will place   order for rehab consult per protocol for full assessment. Please contact me any with questions. ? ?Laura Staley, MS, CCC-SLP ?Rehab Admissions Coordinator  ?[phone] (celll) ?[phone] (office) ? ?

## 2023-08-18 NOTE — Discharge Instructions (Signed)
Lumbar Discectomy °Care After °A discectomy involves removal of discmaterial (the cartilage-like structures located between the bones of the back). It is done to relieve pressure on nerve roots. It can be used as a treatment for a back problem. The time in surgery depends on the findings in surgery and what is necessary to correct the problems. °HOME CARE INSTRUCTIONS  °· Check the cut (incision) made by the surgeon twice a day for signs of infection. Some signs of infection may include:  °· A foul smelling, greenish or yellowish discharge from the wound.  °· Increased pain.  °· Increased redness over the incision (operative) site.  °· The skin edges may separate.  °· Flu-like symptoms (problems).  °· A temperature above 101.5° F (38.6° C).  °· Change your bandages in about 24 to 36 hours following surgery or as directed.  °· You may shower tomrrow.  Avoid bathtubs, swimming pools and hot tubs for three weeks or until your incision has healed completely. °· Follow your doctor's instructions as to safe activities, exercises, and physical therapy.  °· Weight reduction may be beneficial if you are overweight.  °· Daily exercise is helpful to prevent the return of problems. Walking is permitted. You may use a treadmill without an incline. Cut down on activities and exercise if you have discomfort. You may also go up and down stairs as much as you can tolerate.  °· DO NOT lift anything heavier than 10 to 15 lbs. Avoid bending or twisting at the waist. Always bend your knees when lifting.  °· Maintain strength and range of motion as instructed.  °· Do not drive for 10 days, or as directed by your doctors. You may be a passenger . Lying back in the passenger seat may be more comfortable for you. Always wear a seatbelt.  °· Limit your sitting in a regular chair to 20 to 30 minutes at a time. There are no limitations for sitting in a recliner. You should lie down or walk in between sitting periods.  °· Only take  over-the-counter or prescription medicines for pain, discomfort, or fever as directed by your caregiver.  °SEEK MEDICAL CARE IF:  °· There is increased bleeding (more than a small spot) from the wound.  °· You notice redness, swelling, or increasing pain in the wound.  °· Pus is coming from wound.  °· You develop an unexplained oral temperature above 102° F (38.9° C) develops.  °· You notice a foul smell coming from the wound or dressing.  °· You have increasing pain in your wound.  °SEEK IMMEDIATE MEDICAL CARE IF:  °· You develop a rash.  °· You have difficulty breathing.  °· You develop any allergic problems to medicines given.  °Document Released: 10/30/2004 Document Revised: 11/14/2011 Document Reviewed: 02/18/2008 °ExitCare® Patient Information °

## 2023-08-18 NOTE — Op Note (Signed)
08/16/2023  6:54 PM  PATIENT:  Dereck Leep  57 y.o. 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  BLOOD ADMINISTERED:none  CELL SAVER GIVEN:not used  COUNT:per nursing  DRAINS: Urinary Catheter (Foley)   DICTATION: Mr. Kittelson was taken to the operating room, intubated and placed under a general anesthetic without difficulty. He was positioned prone on a Jackson table with all pressure points padded. His lumbar and thoracic regions were prepped and draped in a sterile manner. I opened the lumbar site  with a 10 blade and carried the dissection down to the thoracolumbar fascia. I used both sharp dissection and the monopolar cautery to expose the lamina of L3, and L4. I confirmed my location with an intraoperative xray.  I used the drill, Kerrison punches, and curettes to perform a semihemilaminectomy of L3 on the right side. I used the punches to remove the ligamentum flavum to expose the thecal sac. I brought the microscope into the operative field and started the decompression of the spinal canal, thecal sac and L4 and L4 root(s). I cauterized epidural veins overlying the disc space then divided them sharply. I opened the disc space with a 15 blade and proceeded with the discectomy. I used pituitary rongeurs, curettes, and other instruments to remove disc material. I also encountered fairly calcified ligament  at the L3/4 foramen and removed that with the Kerrison punches. After the discectomy was completed I inspected the L3, and L4 nerve roots and felt it was well decompressed. I explored rostrally, laterally, medially, and caudally and was satisfied with the decompression. I irrigated the wound, then closed in layers. I approximated the thoracolumbar fascia, subcutaneous, and subcuticular planes with vicryl sutures. I used dermabond for a sterile dressing.  I then opened the skin in the thoracic region to expose the T10, and 11 lamina. I used fluoroscopy and had the radiologist confirm my location. With the localization complete I performed hemilaminectomies of T10 and T11 and facetecomies on both the right and left sides. I exposed and identified the disc space on the left side. I opened the disc with a 15 blade. Using the small pituitary rongeur and a small hook I removed a good size disc herniation decompressing the thecal sac. I took a far lateral approach to the disc allowing for the thecal sac to have no retraction placed on it. I the removed calcified pieces of ligament on the right side compressing the thecal sac.  Secondary to the facetectomies I felt the spine had some instability. I placed pedicle screws 7.5 x 40mm on the right side at T10 and 11. I used fluoroscopic guidance to place the screws. No breaches were appreciated. I attached the tulip heads and placed the rod. Locking caps were applied and the construct completed. Some autograft was placed on the right to complete the posterolateral arthrodesis.  I irrigated, assessed the canal and cord and felt they were well decompressed. I irrigated once more then closed the wound. I approximated the thoracolumbar fascia, subcutaneous, and subcuticular planes with vicryl suture. I applied dermabond  and an occlusive bandage for a sterile dressing.  He was rolled onto the or stretcher and extubated.     PLAN OF CARE: Admit to inpatient   PATIENT  DISPOSITION:  PACU - hemodynamically stable.   Delay start of Pharmacological VTE agent (>24hrs) due to surgical blood loss or risk of bleeding:  yes

## 2023-08-19 DIAGNOSIS — M5186 Other intervertebral disc disorders, lumbar region: Secondary | ICD-10-CM | POA: Diagnosis not present

## 2023-08-19 MED FILL — Thrombin For Soln 5000 Unit: CUTANEOUS | Qty: 2 | Status: AC

## 2023-08-19 NOTE — TOC Transition Note (Addendum)
Transition of Care [facility]) - CM/SW Discharge Note   Patient Details  Name: Tyler Ferguson MRN: [id number] Date of Birth: [birth year]  [facility] CM/SW Contact:  Epifanio Lesches, RN Phone Number: 08/19/2023, 9:59 AM   Clinical Narrative:    Patient will DC to: home Anticipated DC date: 08/19/2023 Family notified: yes Transport by: car     -  thoracic hnp  T10/11 with myelopathy Lumbar hnp L3/4     -  s/p discetomy right L3/4, T10, 11 hemilaminectomy for decompression and discetomy    Per MD patient ready for DC today. RN, and patient notified of DC. Pt states girlfriend will assist with care once d/c. Pt with needed DME already @ home. Pt requesting home health services per therapy recommendations. Order placed. Referral made with Amy/[facility] and accepted  Girlfriend to provide transportation to home. Post hospital follow up noted on AVS. Pt without RX med concerns.  RNCM will sign off for now as intervention is no longer needed. Please consult [place] again if new needs arise.    Final next level of care: Home w Home Health Services Barriers to Discharge: No Barriers Identified   Patient Goals and CMS Choice      Discharge Placement                         Discharge Plan and Services Additional resources added to the After Visit Summary for           HH Arranged: PT, OT HH Agency: [facility] Date [facility] Agency Contacted: 08/19/23 Time HH Agency Contacted: [phone] Representative spoke with at [facility] Agency: Amy  Social Determinants of Health (SDOH) Interventions SDOH Screenings   Food Insecurity: No Food Insecurity (08/16/2023)  Housing: Low Risk  (08/16/2023)  Transportation Needs: No Transportation Needs (08/16/2023)  Utilities: Not At Risk (08/16/2023)  Depression (PHQ2-9): Low Risk  (06/06/2023)  Tobacco Use: Medium Risk (08/15/2023)     Readmission Risk Interventions     No data to display

## 2023-08-19 NOTE — Progress Notes (Signed)
Patient okay to D/C after BM, awaiting for MD to call back, patient requesting for PRN prescriptions to be changed, and also waiting if patient is appropriate for Home health.

## 2023-08-19 NOTE — Plan of Care (Signed)
  Problem: Activity: Goal: Risk for activity intolerance will decrease Outcome: Progressing   Problem: Nutrition: Goal: Adequate nutrition will be maintained Outcome: Progressing   Problem: Elimination: Goal: Will not experience complications related to bowel motility Outcome: Progressing   

## 2023-08-19 NOTE — Plan of Care (Signed)

## 2023-08-20 ENCOUNTER — Telehealth: Payer: Self-pay

## 2023-08-20 NOTE — Transitions of Care (Post Inpatient/ED Visit) (Unsigned)
   08/20/2023  Name: Tyler Ferguson MRN: [id number] DOB: [birth year]  Today's TOC FU Call Status: Today's TOC FU Call Status:: Unsuccessful Call (2nd Attempt) Unsuccessful Call (1st Attempt) Date: 08/20/23 Unsuccessful Call (2nd Attempt) Date: 08/20/23  Attempted to reach the patient regarding the most recent Inpatient/ED visit.  Follow Up Plan: Additional outreach attempts will be made to reach the patient to complete the Transitions of Care (Post Inpatient/ED visit) call.   Signature  Karena Addison, LPN [facility] Nurse Health Advisor Direct Dial [phone]

## 2023-08-20 NOTE — Transitions of Care (Post Inpatient/ED Visit) (Unsigned)
   08/20/2023  Name: Tyler Ferguson MRN: [id number] DOB: 03-Aug-1966  Today's TOC FU Call Status: Today's TOC FU Call Status:: Unsuccessful Call (1st Attempt) Unsuccessful Call (1st Attempt) Date: 08/20/23  Attempted to reach the patient regarding the most recent Inpatient/ED visit.  Follow Up Plan: Additional outreach attempts will be made to reach the patient to complete the Transitions of Care (Post Inpatient/ED visit) call.   Signature Karena Addison, LPN [facility] Nurse Health Advisor Direct Dial [phone]

## 2023-08-21 NOTE — Transitions of Care (Post Inpatient/ED Visit) (Signed)
   08/21/2023  Name: Tyler Ferguson MRN: [id number] DOB: [birth year]  Today's TOC FU Call Status: Today's TOC FU Call Status:: Successful TOC FU Call Completed Unsuccessful Call (1st Attempt) Date: 08/20/23 Unsuccessful Call (2nd Attempt) Date: 08/20/23 [facility] FU Call Complete Date: 08/21/23 Patient's Name and Date of Birth confirmed.  Transition Care Management Follow-up Telephone Call Date of Discharge: 08/19/23 Discharge Facility: [facility]) Type of Discharge: Inpatient Admission Primary Inpatient Discharge Diagnosis:: intervertebral disc disease How have you been since you were released from the hospital?: Better Any questions or concerns?: No  Items Reviewed: Did you receive and understand the discharge instructions provided?: Yes Medications obtained,verified, and reconciled?: Yes (Medications Reviewed) Any new allergies since your discharge?: No Dietary orders reviewed?: Yes Do you have support at home?: Yes People in Home: spouse  Medications Reviewed Today: Medications Reviewed Today     Reviewed by Karena Addison, LPN (Licensed Practical Nurse) on 08/21/23 at 1236  Med List Status: <None>   Medication Order Taking? Sig Documenting Provider Last Dose Status Informant  oxyCODONE 10 MG TABS [id number]  Take 0.5-1 tablets (5-10 mg total) by mouth every 6 (six) hours as needed for up to 8 days for severe pain ((score 7 to 10)). Coletta Memos, MD  Active   sildenafil (VIAGRA) 100 MG tablet [id number] No TAKE 1/2 TO 1 (ONE-HALF TO ONE) TABLET BY MOUTH ONCE DAILY AS NEEDED FOR ERECTILE DYSFUNCTION Mliss Sax, MD Past Week Active Self  testosterone cypionate (DEPOTESTOSTERONE CYPIONATE) injection 150 mg [id number]   Mliss Sax, MD  Active   testosterone cypionate (DEPOTESTOTERONE CYPIONATE) injection 100 mg [id number]   Mliss Sax, MD  Active   testosterone cypionate (DEPOTESTOTERONE CYPIONATE) injection 150 mg [id number]   Mliss Sax, MD  Active   Testosterone Cypionate 100 MG/ML SOLN [id number] No Inject 1 1/2 cc into muscle every 14 days. Mliss Sax, MD Past Week Active Self  tiZANidine (ZANAFLEX) 4 MG tablet [id number]  Take 1 tablet (4 mg total) by mouth every 6 (six) hours as needed for muscle spasms. Coletta Memos, MD  Active             Home Care and Equipment/Supplies: Were Home Health Services Ordered?: Yes Name of Home Health Agency:: unknown Has Agency set up a time to come to your home?: Yes First Home Health Visit Date: 08/20/23 Any new equipment or medical supplies ordered?: NA  Functional Questionnaire: Do you need assistance with bathing/showering or dressing?: Yes Do you need assistance with meal preparation?: No Do you need assistance with eating?: No Do you have difficulty maintaining continence: No Do you need assistance with getting out of bed/getting out of a chair/moving?: Yes Do you have difficulty managing or taking your medications?: No  Follow up appointments reviewed: PCP Follow-up appointment confirmed?: Yes Date of PCP follow-up appointment?: 08/22/23 Follow-up Provider: [facility] Follow-up appointment confirmed?: Yes Date of Specialist follow-up appointment?: 08/26/23 Follow-Up Specialty Provider:: surgeon Do you need transportation to your follow-up appointment?: No Do you understand care options if your condition(s) worsen?: Yes-patient verbalized understanding    SIGNATURE Karena Addison, LPN [facility] Nurse Health Advisor Direct Dial [phone]

## 2023-08-22 ENCOUNTER — Ambulatory Visit (INDEPENDENT_AMBULATORY_CARE_PROVIDER_SITE_OTHER): Payer: Commercial Managed Care - HMO | Admitting: Family Medicine

## 2023-08-22 ENCOUNTER — Encounter: Payer: Self-pay | Admitting: Family Medicine

## 2023-08-22 VITALS — BP 124/78 | HR 73 | Temp 98.1°F | Ht 70.0 in | Wt 211.0 lb

## 2023-08-22 DIAGNOSIS — K5903 Drug induced constipation: Secondary | ICD-10-CM | POA: Diagnosis not present

## 2023-08-22 DIAGNOSIS — N319 Neuromuscular dysfunction of bladder, unspecified: Secondary | ICD-10-CM | POA: Diagnosis not present

## 2023-08-22 DIAGNOSIS — R39198 Other difficulties with micturition: Secondary | ICD-10-CM | POA: Insufficient documentation

## 2023-08-22 MED ORDER — POLYETHYLENE GLYCOL 3350 17 GM/SCOOP PO POWD
17.0000 g | Freq: Two times a day (BID) | ORAL | 1 refills | Status: DC | PRN
Start: 2023-08-22 — End: 2023-09-29

## 2023-08-22 NOTE — Progress Notes (Signed)
Established Patient Office Visit   Subjective:  Patient ID: Tyler Ferguson, male    DOB: Apr 24, 1966  Age: 57 y.o. MRN: [id number]  Chief Complaint  Patient presents with   Hospitalization Follow-up    Pt has back surgery 6 days ago at [facility] Pt states he is now experiencing constipation. Right abdominal pain and right lower back pain.     HPI Encounter Diagnoses  Name Primary?   Drug-induced constipation Yes   Difficulty in urination    Neurogenic bladder disorder    Reports difficulty with stooling and constipation since his back surgery 6 days ago.  He is drinking plenty of water and is tried Dulcolax and Metamucil.  He is status post discectomy to the right at L3/4 and hemilaminectomy at T10 and 11 for decompression and discectomy.  He has been suffering myelopathy of the lower extremities. This has been greater on the right. He is taking OxyContin every 6-8 hours for the pain.  Pain is slowly diminishing.  Also for the last 2 to 3 months he has noticed increasing difficulty with urination.  There has been prevoid dribbling and urinary hesitancy.  The symptoms seem to begin near the onset of above-mentioned myelopathy.  He denies any issues prior to this time with urination.   Review of Systems  Constitutional: Negative.   HENT: Negative.    Eyes:  Negative for blurred vision, discharge and redness.  Respiratory: Negative.    Cardiovascular: Negative.   Gastrointestinal:  Positive for constipation. Negative for abdominal pain.  Genitourinary:  Positive for flank pain.  Musculoskeletal:  Positive for back pain. Negative for myalgias.  Skin:  Negative for rash.  Neurological:  Positive for tingling. Negative for loss of consciousness and weakness.  Endo/Heme/Allergies:  Negative for polydipsia.     Current Outpatient Medications:    oxyCODONE 10 MG TABS, Take 0.5-1 tablets (5-10 mg total) by mouth every 6 (six) hours as needed for up to 8 days for severe pain ((score 7 to  10))., Disp: 30 tablet, Rfl: 0   polyethylene glycol powder (GLYCOLAX/MIRALAX) 17 GM/SCOOP powder, Take 17 g by mouth 2 (two) times daily as needed., Disp: 578 g, Rfl: 1   sildenafil (VIAGRA) 100 MG tablet, TAKE 1/2 TO 1 (ONE-HALF TO ONE) TABLET BY MOUTH ONCE DAILY AS NEEDED FOR ERECTILE DYSFUNCTION, Disp: 20 tablet, Rfl: 4   Testosterone Cypionate 100 MG/ML SOLN, Inject 1 1/2 cc into muscle every 14 days., Disp: 20 mL, Rfl: 1   tiZANidine (ZANAFLEX) 4 MG tablet, Take 1 tablet (4 mg total) by mouth every 6 (six) hours as needed for muscle spasms., Disp: 60 tablet, Rfl: 0  Current Facility-Administered Medications:    testosterone cypionate (DEPOTESTOSTERONE CYPIONATE) injection 150 mg, 150 mg, Intramuscular, Q14 Days, Mliss Sax, MD   testosterone cypionate (DEPOTESTOTERONE CYPIONATE) injection 100 mg, 100 mg, Intramuscular, Q14 Days, Mliss Sax, MD, 100 mg at 01/22/23 1525   testosterone cypionate (DEPOTESTOTERONE CYPIONATE) injection 150 mg, 150 mg, Intramuscular, Q14 Days, Mliss Sax, MD, 100 mg at 12/10/22 1509   Objective:     BP 124/78   Pulse 73   Temp 98.1 F (36.7 C)   Ht 5\' 10"  (1.778 m)   Wt 211 lb (95.7 kg)   SpO2 98%   BMI 30.28 kg/m    Physical Exam Constitutional:      General: He is not in acute distress.    Appearance: Normal appearance. He is not ill-appearing, toxic-appearing or diaphoretic.  HENT:  Head: Normocephalic and atraumatic.     Right Ear: External ear normal.     Left Ear: External ear normal.  Eyes:     General: No scleral icterus.       Right eye: No discharge.        Left eye: No discharge.     Extraocular Movements: Extraocular movements intact.     Conjunctiva/sclera: Conjunctivae normal.  Pulmonary:     Effort: Pulmonary effort is normal. No respiratory distress.  Abdominal:     General: Bowel sounds are normal.     Palpations: Abdomen is soft.     Tenderness: There is right CVA tenderness (mild  upper right CVA ttp). There is no guarding.     Comments: Bloating noted RUQ  Skin:    General: Skin is warm and dry.  Neurological:     Mental Status: He is alert and oriented to person, place, and time.  Psychiatric:        Mood and Affect: Mood normal.        Behavior: Behavior normal.      No results found for any visits on 08/22/23.    The ASCVD Risk score (Arnett DK, et al., 2019) failed to calculate for the following reasons:   Unable to determine if patient is Non-Hispanic African American    Assessment & Plan:   Drug-induced constipation -     Polyethylene Glycol 3350; Take 17 g by mouth 2 (two) times daily as needed.  Dispense: 578 g; Refill: 1  Difficulty in urination -     Ambulatory referral to Urology -     Urinalysis, Routine w reflex microscopic; Future  Neurogenic bladder disorder -     Ambulatory referral to Urology    Return in about 6 weeks (around 10/03/2023).    Mliss Sax, MD

## 2023-08-25 ENCOUNTER — Other Ambulatory Visit: Payer: Self-pay | Admitting: Family Medicine

## 2023-08-25 ENCOUNTER — Telehealth: Payer: Self-pay | Admitting: Family Medicine

## 2023-08-25 DIAGNOSIS — E291 Testicular hypofunction: Secondary | ICD-10-CM

## 2023-08-25 NOTE — Telephone Encounter (Signed)
Pt needs his Testosterone Cypionate 100 MG/ML SOLN [[id number]] refilled.

## 2023-08-26 LAB — URINALYSIS, ROUTINE W REFLEX MICROSCOPIC
Bilirubin Urine: NEGATIVE
Hgb urine dipstick: NEGATIVE
Ketones, ur: NEGATIVE
Leukocytes,Ua: NEGATIVE
Nitrite: NEGATIVE
RBC / HPF: NONE SEEN (ref 0–?)
Specific Gravity, Urine: <=1.005 — AB (ref 1.000–1.030)
Total Protein, Urine: NEGATIVE
Urine Glucose: NEGATIVE
Urobilinogen, UA: 0.2 (ref 0.0–1.0)
WBC, UA: NONE SEEN (ref 0–?)
pH: 6.5 (ref 5.0–8.0)

## 2023-08-26 MED ORDER — TESTOSTERONE CYPIONATE 100 MG/ML IJ SOLN
INTRAMUSCULAR | 1 refills | Status: DC
Start: 2023-08-26 — End: 2024-06-03

## 2023-08-27 ENCOUNTER — Encounter: Payer: Self-pay | Admitting: Gastroenterology

## 2023-08-27 ENCOUNTER — Ambulatory Visit: Payer: Commercial Managed Care - HMO | Admitting: Gastroenterology

## 2023-08-27 VITALS — BP 110/72 | HR 59 | Ht 70.0 in | Wt 208.4 lb

## 2023-08-27 DIAGNOSIS — L309 Dermatitis, unspecified: Secondary | ICD-10-CM | POA: Diagnosis not present

## 2023-08-27 DIAGNOSIS — R159 Full incontinence of feces: Secondary | ICD-10-CM | POA: Diagnosis not present

## 2023-08-27 DIAGNOSIS — K649 Unspecified hemorrhoids: Secondary | ICD-10-CM | POA: Diagnosis not present

## 2023-08-27 DIAGNOSIS — K5903 Drug induced constipation: Secondary | ICD-10-CM | POA: Diagnosis not present

## 2023-08-27 DIAGNOSIS — R1900 Intra-abdominal and pelvic swelling, mass and lump, unspecified site: Secondary | ICD-10-CM

## 2023-08-27 NOTE — Progress Notes (Signed)
HPI :  57 year old male here for reassessment of hemorrhoids, fecal leakage, protrusion from his abdominal wall.  Recall that I saw him previously for symptoms of hemorrhoids.  He had a colonoscopy in April, large hemorrhoids on the exam and he has had ongoing symptoms of prolapse, leakage and occasional bleeding.  He endorses chronic straining with his bowel movements, spending a lot of time in the toilet since he had a back surgery 16 years ago.  He is concerned he has had nerve damage to his anal area in regards to problems with his bowels.  We performed hemorrhoid banding in April of the left lateral hemorrhoid and banded the RP hemorrhoid in June.  He is not having much bleeding from his hemorrhoids but he continues to have some leakage and continues to have straining with his bowel habits.  I had placed him on Citrucel for his bowels previously.  He had neurosurgery in September 7th, status post discectomy to the right at L3/4 and hemilaminectomy at T10 and 11 for decompression and discectomy. Had been on oxycodone. Started on Miralax for constipation.  He states he has been trying to minimize oxycodone use, but it definitely got him constipated when he for started taking it.  MiraLAX has helped to keep the stool little bit softer and he is trying to back off the oxycodone now.  He continues to have fecal leakage, occasional bleeding symptoms from his hemorrhoids.  He does not think the banding has provided significant benefit yet.  He otherwise endorses perianal itching for the past week or so, states he feels that his perianal area is irritated.  Otherwise, for the past week since his surgery he has felt a protrusion of his right abdominal wall.  He states it does not painful but it is asymmetric and there all the time.  He states it really has not gone away since he has noticed it.     Colonoscopy 03/25/22: Prolapsing hemorrhoids were found on perianal exam. - A 4 mm polyp was found in  the ascending colon. The polyp was flat. The polyp was removed with a cold snare. Resection and retrieval were complete. - Two flat and sessile polyps were found in the hepatic flexure. The polyps were 3 to 4 mm in size. These polyps were removed with a cold snare. Resection and retrieval were complete. - Two flat and sessile polyps were found in the transverse colon. The polyps were 3 mm in size. These polyps were removed with a cold snare. Resection and retrieval were complete. - A 5 mm polyp was found in the descending colon. The polyp was sessile. The polyp was removed with a cold snare. Resection and retrieval were complete. - There was spasm in the entire colon. - A few small-mouthed diverticula were found in the sigmoid colon. - Internal hemorrhoids were found during retroflexion and were large in size. - The exam was otherwise without abnormality.   Surgical [P], colon, descending, hepatic flexure, transverse, and ascending, polyp (6) - SERRATED MUCOSAL POLYPS (2 FRAGMENTS) WITH MILD BASAL CRYPT GROWTH ABNORMALITY, FAVOR SESSILE SERRATED POLYPS, NEGATIVE FOR DYSPLASIA. - TUBULAR ADENOMA, NEGATIVE FOR HIGH-GRADE DYSPLASIA. - SERRATED MUCOSAL FRAGMENT, FAVOR HYPERPLASTIC POLYP.    Past Medical History:  Diagnosis Date   Arthritis    Colon polyps    Hemorrhoids      Past Surgical History:  Procedure Laterality Date   BACK SURGERY  2007   COLONOSCOPY  03/25/2022   LUMBAR LAMINECTOMY/DECOMPRESSION MICRODISCECTOMY Right 08/16/2023  Procedure: Lumbar Three-Lumbar Four right discetomy and decompression;  Surgeon: Coletta Memos, MD;  Location: [facility] OR;  Service: Neurosurgery;  Laterality: Right;   THORACIC DISCECTOMY N/A 08/16/2023   Procedure: THORACIC TEN-THORACIC ELEVEN discetomy, decompression and Fusion;  Surgeon: Coletta Memos, MD;  Location: [facility] OR;  Service: Neurosurgery;  Laterality: N/A;   Family History  Problem Relation Age of Onset   Heart disease Mother    Congestive  Heart Failure Mother    Cancer Father        Prostate Cancer   Cancer Sister        non-Hodgkin lymphoma   Diabetes Sister    Heart attack Brother    Colon cancer Neg Hx    Colon polyps Neg Hx    Esophageal cancer Neg Hx    Rectal cancer Neg Hx    Stomach cancer Neg Hx    Social History   Tobacco Use   Smoking status: Former    Current packs/day: 1.00    Types: Cigarettes   Smokeless tobacco: Never  Vaping Use   Vaping status: Never Used  Substance Use Topics   Alcohol use: Yes    Alcohol/week: 3.0 standard drinks of alcohol    Types: 3 Cans of beer per week    Comment: social   Drug use: Never   Current Outpatient Medications  Medication Sig Dispense Refill   magnesium gluconate (MAGONATE) 500 MG tablet Take 250 mg by mouth 2 (two) times daily.     polyethylene glycol powder (GLYCOLAX/MIRALAX) 17 GM/SCOOP powder Take 17 g by mouth 2 (two) times daily as needed. 578 g 1   sildenafil (VIAGRA) 100 MG tablet TAKE 1/2 TO 1 (ONE-HALF TO ONE) TABLET BY MOUTH ONCE DAILY AS NEEDED FOR ERECTILE DYSFUNCTION 20 tablet 4   testosterone cypionate (DEPOTESTOTERONE CYPIONATE) 100 MG/ML injection INJECT 1 & 1/2 ML INTRAMUSCULARLY EVERY 2 WEEKS 10 mL 0   Testosterone Cypionate 100 MG/ML SOLN Inject 1 1/2 cc into muscle every 14 days. 20 mL 1   tiZANidine (ZANAFLEX) 4 MG tablet Take 1 tablet (4 mg total) by mouth every 6 (six) hours as needed for muscle spasms. 60 tablet 0   Current Facility-Administered Medications  Medication Dose Route Frequency Provider Last Rate Last Admin   testosterone cypionate (DEPOTESTOSTERONE CYPIONATE) injection 150 mg  150 mg Intramuscular Q14 Days Mliss Sax, MD       testosterone cypionate (DEPOTESTOTERONE CYPIONATE) injection 100 mg  100 mg Intramuscular Q14 Days Mliss Sax, MD   100 mg at 01/22/23 1525   testosterone cypionate (DEPOTESTOTERONE CYPIONATE) injection 150 mg  150 mg Intramuscular Q14 Days Mliss Sax, MD   100  mg at 12/10/22 1509   No Known Allergies   Review of Systems: All systems reviewed and negative except where noted in HPI.   Lab Results  Component Value Date   WBC 10.3 08/16/2023   HGB 13.4 08/16/2023   HCT 39.9 08/16/2023   MCV 92.4 08/16/2023   PLT 247 08/16/2023    Lab Results  Component Value Date   NA 137 05/26/2023   CL 102 05/26/2023   K 4.0 05/26/2023   CO2 28 05/26/2023   BUN 14 05/26/2023   CREATININE 0.83 08/16/2023   GFRNONAA >60 08/16/2023   CALCIUM 9.0 05/26/2023   ALBUMIN 4.0 05/26/2023   GLUCOSE 94 05/26/2023    Lab Results  Component Value Date   ALT 24 05/26/2023   AST 19 05/26/2023   ALKPHOS 51 05/26/2023  BILITOT 0.4 05/26/2023     Physical Exam: BP 110/72   Pulse (!) 59   Ht 5\' 10"  (1.778 m)   Wt 208 lb 6.4 oz (94.5 kg)   BMI 29.90 kg/m  Constitutional: Pleasant,well-developed, male in no acute distress. Abdominal: Soft, nondistended, nontender. Asymmetric abdominal wall more prominent with standing - protrusion inn R mid abdomen, nontender. Negative carnett, does not appear to be a hernia.    DRE - CMA June McMurray standby - low resting anal tone, inflamed grade II hemorrhoids, no mass lesions, perianal dermatitis Neurological: Alert and oriented to person place and time.  Psychiatric: Normal mood and affect. Behavior is normal.   ASSESSMENT: 57 y.o. male here for assessment of the following  1. Fecal soiling due to fecal incontinence   2. Hemorrhoids, unspecified hemorrhoid type   3. Perianal dermatitis   4. Drug-induced constipation   5. Abdominal wall bulge    Initially thought some of his fecal leakage was due to hemorrhoids, status post 2 banding's and this has not made a significant difference yet.  He reports nerve damage from prior back surgery, he has a low resting anal tone, I suspect he may be correct and that he has some dysfunction of his pelvic floor causing the symptoms.  To clarify this I offered him an  anorectal manometry to further evaluate, see if there is anything pelvic floor physical therapy could help him with.  Following discussion of anorectal manometry he is agreeable.  His hemorrhoids are inflamed and I think that is causing his bleeding symptoms, he recently had some drug-induced constipation from opioids.  Now on MiraLAX while on opioids which has helped, however he does not tolerate liquid stools in regards to leakage.  Once he is off the opioids I recommend switching him to Citrucel to try to bulk stools and minimize fecal leakage.  He wanted to hold off on additional hemorrhoid banding today.  Will give him some Calmol 4 suppositories to use as needed for irritation.  He does have a perianal dermatitis, which does extend into his gluteal cleft somewhat.  Psoriasis is on the differential, although not sure exactly what this represents.  I recommend a dermatology evaluation to exclude psoriasis.  It could otherwise simply be due to fecal leakage and irritation there from that..  In the interim we will try some Desitin ointment to the perianal area as a barrier therapy to see if this will help reduce his pruritus.  For the past week since his surgery he has noted protrusion of his right abdominal wall.  I do not palpate an obvious hernia there although it is quite asymmetric.  He had a CT scan of his abdomen a year ago which showed some abdominal wall injury from a trauma.  I reassured him I am not palpating his colon and this protrusion appears quite superficial, muscular, not sure if he had some musculoskeletal trauma from his surgery, timeline with that he just noted it postoperatively.  I offered him a CT scan to further evaluate this, although alternatively we could just watch this for another week or 2 and see if this resolves on its own.  He would prefer to monitor this for the next few weeks and if it fails to resolve or progresses then we will pursue CT imaging.  He will contact me if he  wants to pursue imaging  PLAN: - refer for anorectal manometry to be done at [facility] hospital - first available - continue Miralax while  on oxycodone. Minimize oxycodone and when he stops it should transition to Citrucel once daily and stop Miralax - Calmol4 suppositories to use PRN, samples provided - holding off on additional hemorrhoid banding therapy at this time - apply Desitin twice daily to perianal area - referral to Dermatology for perianal rash - rule out psoriasis - offered CT scan, will monitor abdominal wall for next 1-2 weeks if persistent he will do imaging  Harlin Rain, MD [facility]

## 2023-08-27 NOTE — Patient Instructions (Addendum)
_______________________________________________________  If your blood pressure at your visit was 140/90 or greater, please contact your primary care physician to follow up on this.  _______________________________________________________  If you are age 59 or older, your body mass index should be between 23-30. Your Body mass index is 29.9 kg/m. If this is out of the aforementioned range listed, please consider follow up with your Primary Care Provider.  If you are age 36 or younger, your body mass index should be between 19-25. Your Body mass index is 29.9 kg/m. If this is out of the aformentioned range listed, please consider follow up with your Primary Care Provider.   ________________________________________________________  The [facility] providers would like to encourage you to use [facility] to communicate with providers for non-urgent requests or questions.  Due to long hold times on the telephone, sending your provider a message by [facility] may be a faster and more efficient way to get a response.  Please allow 48 business hours for a response.  Please remember that this is for non-urgent requests.  _______________________________________________________  Take Miralax  as long as you take the pain medications. After that start a daily fiber supplement such as Citrucel.   Use Calmol 4 suppositories as needed.   Start Desitin twice a day.  We have sent your records to dermatology, Dr Langston Reusing. [address], [place], [place] [address] Phone: [phone]   It was a pleasure to see you today!  Thank you for trusting me with your gastrointestinal care!

## 2023-08-28 ENCOUNTER — Telehealth: Payer: Self-pay

## 2023-08-28 DIAGNOSIS — L309 Dermatitis, unspecified: Secondary | ICD-10-CM

## 2023-08-28 NOTE — Telephone Encounter (Signed)
Referral sent to dermatology per Dr Lanetta Inch request,  see office notes 08-27-2023. Will await appointment info.

## 2023-09-03 ENCOUNTER — Telehealth: Payer: Self-pay | Admitting: Gastroenterology

## 2023-09-03 DIAGNOSIS — R1900 Intra-abdominal and pelvic swelling, mass and lump, unspecified site: Secondary | ICD-10-CM

## 2023-09-03 NOTE — Telephone Encounter (Signed)
CT order in epic. Secure staff message sent to radiology scheduling to contact patient to set up appt.   Called and left pt a detailed vm letting him know that we have ordered the CT scan and radiology scheduling will reach out to him directly to set up his appt. I provided pt with the phone number to radiology scheduling in case he does not hear from them in a timely manner. I advised pt to call back with any concerns.

## 2023-09-03 NOTE — Telephone Encounter (Signed)
Okay can order CT scan abdomen pelvis to further evaluate abdominal wall deformity / asymmetry. Can you help coordinate for him? Thanks

## 2023-09-03 NOTE — Telephone Encounter (Signed)
PT is calling to let us know that his stomach is still asymetrical. He is having sharp, burning pains from his abdomen to back with aching. His stomach is still protruding and he wants to know if the CT order can be put in immediately to find out what is wrong. Please advise.

## 2023-09-04 ENCOUNTER — Telehealth: Payer: Self-pay

## 2023-09-04 DIAGNOSIS — R1900 Intra-abdominal and pelvic swelling, mass and lump, unspecified site: Secondary | ICD-10-CM

## 2023-09-04 NOTE — Telephone Encounter (Signed)
-----   Message from April M sent at 09/04/2023 12:53 PM EDT ----- Regarding: change of facility Hello- pt's insurance would not approve hospital facility. I did get the scan approved at [facility].  Authorization Number: [id number]  Auth Effective Date: 09/04/2023 Auth End Date: 03/02/2024  Name:[facility] [address], [place], [place] Phone: [phone]  Fax: [phone]  Thanks!

## 2023-09-04 NOTE — Telephone Encounter (Signed)
Called and informed pt that we have cancelled CT scan at [facility] on 09/11/23 due to his insurance not authorizing hospital facility. I informed pt that his insurance has approved CT at [facility] and we will have them reach out to him to schedule. Pt verbalized understanding and had no concerns at the end of the call.   CT order updated in epic.

## 2023-09-11 ENCOUNTER — Ambulatory Visit (HOSPITAL_COMMUNITY): Payer: Commercial Managed Care - HMO

## 2023-09-11 NOTE — Telephone Encounter (Signed)
Checked on referral status. They do have him in the queue.

## 2023-09-25 ENCOUNTER — Ambulatory Visit
Admission: RE | Admit: 2023-09-25 | Discharge: 2023-09-25 | Disposition: A | Discharge status: Home / Self Care | Payer: Self-pay | Source: Ambulatory Visit | Attending: Gastroenterology | Admitting: Gastroenterology

## 2023-09-25 DIAGNOSIS — R1900 Intra-abdominal and pelvic swelling, mass and lump, unspecified site: Secondary | ICD-10-CM

## 2023-09-25 MED ORDER — IOPAMIDOL (ISOVUE-300) INJECTION 61%
500.0000 mL | Freq: Once | INTRAVENOUS | Status: AC | PRN
  Administered 2023-09-25: 100 mL via INTRAVENOUS

## 2023-09-25 NOTE — Telephone Encounter (Signed)
Can you please check on this? Thank you!

## 2023-09-25 NOTE — Telephone Encounter (Signed)
Correction,  Misread the request.  Will check on referral status with [facility]

## 2023-09-25 NOTE — Telephone Encounter (Signed)
I can see it

## 2023-09-26 NOTE — Telephone Encounter (Signed)
The referral that was placed was in error. Put in new referral to [facility]. Left detailed message on the referral line to schedule patient to be seen.

## 2023-09-29 ENCOUNTER — Encounter: Payer: Self-pay | Admitting: Urology

## 2023-09-29 ENCOUNTER — Ambulatory Visit: Payer: Managed Care, Other (non HMO) | Admitting: Urology

## 2023-09-29 VITALS — BP 151/83 | HR 68 | Ht 70.0 in | Wt 205.0 lb

## 2023-09-29 DIAGNOSIS — R3915 Urgency of urination: Secondary | ICD-10-CM | POA: Diagnosis not present

## 2023-09-29 DIAGNOSIS — R39198 Other difficulties with micturition: Secondary | ICD-10-CM

## 2023-09-29 LAB — BLADDER SCAN AMB NON-IMAGING

## 2023-09-29 NOTE — Progress Notes (Signed)
Assessment: 1. Urgency of urination     Plan: I personally reviewed the patient's chart including provider notes, and lab results. His symptoms seem to be related to his recent back surgery and longstanding back issues. I discussed a trial of medical therapy for his symptoms.  At the present time, he does not feel that the severity of symptoms warrant any treatment.  I think this is reasonable. Recommend continued observation. Return to office in 3 months.   Chief Complaint:  Chief Complaint  Patient presents with   Urinary Urgency    History of Present Illness:  Tyler Ferguson is a 57 y.o. male who is seen in consultation from Mliss Sax, MD for evaluation of lower urinary tract symptoms.  He reports onset of symptoms approximately 4 months ago.  He associates this with some worsening of his back issues.  He reports intermittent frequency, urgency and occasional urge incontinence.  He reports voiding with a fairly good stream and does not have significant hesitancy.  No dysuria or gross hematuria.  No recent UTIs.  He also reports numbness in the scrotal area and perineum. He underwent surgery in the lower thoracic and lumbar spine area approximately 6 weeks ago.  He continues to recover with improvement in his lower extremity strength and numbness. IPSS = 9 today.   He has been on testosterone replacement for several years.  This is managed by Dr. Doreene Burke. PSA 6/24:  0.98  Past Medical History:  Past Medical History:  Diagnosis Date   Arthritis    Colon polyps    Hemorrhoids     Past Surgical History:  Past Surgical History:  Procedure Laterality Date   BACK SURGERY  2007   COLONOSCOPY  03/25/2022   LUMBAR LAMINECTOMY/DECOMPRESSION MICRODISCECTOMY Right 08/16/2023   Procedure: Lumbar Three-Lumbar Four right discetomy and decompression;  Surgeon: Coletta Memos, MD;  Location: [facility] OR;  Service: Neurosurgery;  Laterality: Right;   THORACIC DISCECTOMY N/A 08/16/2023    Procedure: THORACIC TEN-THORACIC ELEVEN discetomy, decompression and Fusion;  Surgeon: Coletta Memos, MD;  Location: [facility] OR;  Service: Neurosurgery;  Laterality: N/A;    Allergies:  No Known Allergies  Family History:  Family History  Problem Relation Age of Onset   Heart disease Mother    Congestive Heart Failure Mother    Cancer Father        Prostate Cancer   Cancer Sister        non-Hodgkin lymphoma   Diabetes Sister    Heart attack Brother    Colon cancer Neg Hx    Colon polyps Neg Hx    Esophageal cancer Neg Hx    Rectal cancer Neg Hx    Stomach cancer Neg Hx     Social History:  Social History   Tobacco Use   Smoking status: Former    Current packs/day: 1.00    Types: Cigarettes   Smokeless tobacco: Never  Vaping Use   Vaping status: Never Used  Substance Use Topics   Alcohol use: Yes    Alcohol/week: 3.0 standard drinks of alcohol    Types: 3 Cans of beer per week    Comment: social   Drug use: Never    Review of symptoms:  Constitutional:  Negative for unexplained weight loss, night sweats, fever, chills ENT:  Negative for nose bleeds, sinus pain, painful swallowing CV:  Negative for chest pain, shortness of breath, exercise intolerance, palpitations, loss of consciousness Resp:  Negative for cough, wheezing, shortness of breath GI:  Negative for nausea, vomiting, diarrhea, bloody stools GU:  Positives noted in HPI; otherwise negative for gross hematuria, dysuria Neuro:  Negative for seizures, poor balance, slurred speech Psych:  Negative for lack of energy, depression, anxiety Endocrine:  Negative for polydipsia, polyuria, symptoms of hypoglycemia (dizziness, hunger, sweating) Hematologic:  Negative for anemia, purpura, petechia, prolonged or excessive bleeding, use of anticoagulants  Allergic:  Negative for difficulty breathing or choking as a result of exposure to anything; no shellfish allergy; no allergic response (rash/itch) to materials,  foods  Physical exam: BP (!) 151/83   Pulse 68   Ht 5\' 10"  (1.778 m)   Wt 205 lb (93 kg)   BMI 29.41 kg/m  GENERAL APPEARANCE:  Well appearing, well developed, well nourished, NAD HEENT: Atraumatic, Normocephalic, oropharynx clear. NECK: Supple without lymphadenopathy or thyromegaly. LUNGS: Clear to auscultation bilaterally. HEART: Regular Rate and Rhythm without murmurs, gallops, or rubs. ABDOMEN: Soft, non-tender, No Masses. EXTREMITIES: Moves all extremities well.  Without clubbing, cyanosis, or edema. NEUROLOGIC:  Alert and oriented x 3, normal gait, CN II-XII grossly intact.  MENTAL STATUS:  Appropriate. BACK:  Non-tender to palpation.  No CVAT SKIN:  Warm, dry and intact.   GU: Penis:  circumcised Meatus: Normal Scrotum: normal, no masses Testis: normal without masses bilateral Prostate: 40 g, NT, no nodules Rectum: Normal tone,  no masses or tenderness   Results: U/A:  negative  PVR:  0 ml

## 2023-09-30 ENCOUNTER — Ambulatory Visit: Payer: Managed Care, Other (non HMO) | Admitting: Family Medicine

## 2023-09-30 ENCOUNTER — Encounter: Payer: Self-pay | Admitting: Family Medicine

## 2023-09-30 VITALS — BP 142/80 | HR 86 | Temp 97.9°F | Ht 70.0 in | Wt 202.3 lb

## 2023-09-30 DIAGNOSIS — Z789 Other specified health status: Secondary | ICD-10-CM

## 2023-09-30 DIAGNOSIS — R030 Elevated blood-pressure reading, without diagnosis of hypertension: Secondary | ICD-10-CM | POA: Diagnosis not present

## 2023-09-30 NOTE — Progress Notes (Signed)
Established Patient Office Visit   Subjective:  Patient ID: Tyler Ferguson, male    DOB: Jan 04, 1966  Age: 57 y.o. MRN: [id number]  Chief Complaint  Patient presents with   Medical Management of Chronic Issues    6 weeks follow up.     HPI Encounter Diagnoses  Name Primary?   Elevated BP without diagnosis of hypertension Yes   Alcohol use    For follow-up of above and paresthesias and weakness in his lower extremities.  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  Will probably not be returning for the foreseeable future.  He is self-employed.  He continues with physical therapy.  No history of hypertension.  He is to be married in 2 days.  He did consume 6 beers last night and a pool tournament.  He does not drink regularly.  He did discontinue creatine.  He is working with the Social Security administration for at least temporary disability.   Review of Systems  Constitutional: Negative.   HENT: Negative.    Eyes:  Negative for blurred vision, discharge and redness.  Respiratory: Negative.    Cardiovascular: Negative.   Gastrointestinal:  Negative for abdominal pain.  Genitourinary: Negative.   Musculoskeletal: Negative.  Negative for myalgias.  Skin:  Negative for rash.  Neurological:  Positive for tingling and weakness. Negative for loss of consciousness.  Endo/Heme/Allergies:  Negative for polydipsia.     Current Outpatient Medications:    sildenafil (VIAGRA) 100 MG tablet, TAKE 1/2 TO 1 (ONE-HALF TO ONE) TABLET BY MOUTH ONCE DAILY AS NEEDED FOR ERECTILE DYSFUNCTION, Disp: 20 tablet, Rfl: 4   testosterone cypionate (DEPOTESTOTERONE CYPIONATE) 100 MG/ML injection, INJECT 1 & 1/2 ML INTRAMUSCULARLY EVERY 2 WEEKS, Disp: 10 mL, Rfl: 0   Testosterone Cypionate 100 MG/ML SOLN, Inject 1 1/2 cc into muscle every 14 days., Disp: 20 mL, Rfl: 1   tiZANidine (ZANAFLEX) 4 MG tablet, Take 1 tablet (4 mg total) by mouth every 6 (six) hours as needed for muscle  spasms., Disp: 60 tablet, Rfl: 0  Current Facility-Administered Medications:    testosterone cypionate (DEPOTESTOSTERONE CYPIONATE) injection 150 mg, 150 mg, Intramuscular, Q14 Days, Mliss Sax, MD   testosterone cypionate (DEPOTESTOTERONE CYPIONATE) injection 100 mg, 100 mg, Intramuscular, Q14 Days, Mliss Sax, MD, 100 mg at 01/22/23 1525   testosterone cypionate (DEPOTESTOTERONE CYPIONATE) injection 150 mg, 150 mg, Intramuscular, Q14 Days, Mliss Sax, MD, 100 mg at 12/10/22 1509   Objective:     BP (!) 142/80   Pulse 86   Temp 97.9 F (36.6 C)   Ht 5\' 10"  (1.778 m)   Wt 202 lb 4.8 oz (91.8 kg)   SpO2 96%   BMI 29.03 kg/m  BP Readings from Last 3 Encounters:  09/30/23 (!) 142/80  09/29/23 (!) 151/83  08/27/23 110/72   Wt Readings from Last 3 Encounters:  09/30/23 202 lb 4.8 oz (91.8 kg)  09/29/23 205 lb (93 kg)  08/27/23 208 lb 6.4 oz (94.5 kg)      Physical Exam Constitutional:      General: He is not in acute distress.    Appearance: Normal appearance. He is not ill-appearing, toxic-appearing or diaphoretic.  HENT:     Head: Normocephalic and atraumatic.     Right Ear: External ear normal.     Left Ear: External ear normal.  Eyes:     General: No scleral icterus.       Right eye: No discharge.  Left eye: No discharge.     Extraocular Movements: Extraocular movements intact.     Conjunctiva/sclera: Conjunctivae normal.  Pulmonary:     Effort: Pulmonary effort is normal. No respiratory distress.  Skin:    General: Skin is warm and dry.  Neurological:     Mental Status: He is alert and oriented to person, place, and time.  Psychiatric:        Mood and Affect: Mood normal.        Behavior: Behavior normal.      No results found for any visits on 09/30/23.    The ASCVD Risk score (Arnett DK, et al., 2019) failed to calculate for the following reasons:   Unable to determine if patient is Non-Hispanic African  American    Assessment & Plan:   Elevated BP without diagnosis of hypertension  Alcohol use    Return in about 3 months (around 12/31/2023).  I can write a letter supporting his disability from June 6 through the foreseeable future secondary to ongoing lower extremity weakness and paresthesias.  Believe that his elevated blood pressure today is likely secondary to alcohol consumed last night.  We discussed this.  Advised that he should consume no more than 2 drinks or 2 beers on a given day.  Information was given preventing hypertension and binge drinking.  Mliss Sax, MD

## 2023-10-02 LAB — URINALYSIS, ROUTINE W REFLEX MICROSCOPIC
Bilirubin, UA: NEGATIVE
Glucose, UA: NEGATIVE
Ketones, UA: NEGATIVE
Leukocytes,UA: NEGATIVE
Nitrite, UA: NEGATIVE
Protein,UA: NEGATIVE
RBC, UA: NEGATIVE
Specific Gravity, UA: 1.02 (ref 1.005–1.030)
Urobilinogen, Ur: 0.2 mg/dL (ref 0.2–1.0)
pH, UA: 7 (ref 5.0–7.5)

## 2023-10-03 ENCOUNTER — Ambulatory Visit: Payer: Commercial Managed Care - HMO | Admitting: Family Medicine

## 2023-10-06 ENCOUNTER — Ambulatory Visit: Payer: Commercial Managed Care - HMO | Admitting: Family Medicine

## 2023-10-10 ENCOUNTER — Ambulatory Visit: Payer: Commercial Managed Care - HMO | Admitting: Family Medicine

## 2023-10-21 ENCOUNTER — Encounter: Payer: Self-pay | Admitting: Family Medicine

## 2023-10-21 ENCOUNTER — Telehealth: Payer: Self-pay | Admitting: Family Medicine

## 2023-10-21 DIAGNOSIS — Z0279 Encounter for issue of other medical certificate: Secondary | ICD-10-CM

## 2023-10-21 NOTE — Telephone Encounter (Signed)
Pt called to check on the disability paperwork and needs to pick it up today. Please call when ready  Jimar [phone]

## 2023-12-16 NOTE — Progress Notes (Signed)
 Spoke with patient regarding upcoming appointment for anorectal manometry; patient states he feels like he does not need this test and wishes to cancel the appointment. Advised patient to follow up with Dr Leigh for any future concerns. Patient verbalized understanding.

## 2023-12-22 ENCOUNTER — Ambulatory Visit: Payer: Managed Care, Other (non HMO) | Admitting: Family Medicine

## 2023-12-23 ENCOUNTER — Encounter: Payer: Self-pay | Admitting: Family Medicine

## 2023-12-23 ENCOUNTER — Telehealth: Payer: Self-pay | Admitting: Family Medicine

## 2023-12-23 NOTE — Telephone Encounter (Signed)
 12/22/2023 1st no show, letter sent via [facility]

## 2023-12-24 ENCOUNTER — Ambulatory Visit (HOSPITAL_COMMUNITY): Admit: 2023-12-24 | Payer: Commercial Managed Care - HMO | Admitting: Gastroenterology

## 2023-12-24 ENCOUNTER — Encounter (HOSPITAL_COMMUNITY): Payer: Self-pay

## 2023-12-24 SURGERY — ANO RECTAL MANOMETRY

## 2023-12-29 ENCOUNTER — Ambulatory Visit: Payer: Commercial Managed Care - HMO | Admitting: Urology

## 2024-01-02 ENCOUNTER — Ambulatory Visit (INDEPENDENT_AMBULATORY_CARE_PROVIDER_SITE_OTHER): Payer: 59 | Admitting: Family Medicine

## 2024-01-02 ENCOUNTER — Encounter: Payer: Self-pay | Admitting: Family Medicine

## 2024-01-02 VITALS — BP 130/78 | HR 77 | Temp 98.3°F | Ht 70.0 in | Wt 211.8 lb

## 2024-01-02 DIAGNOSIS — N5239 Other post-surgical erectile dysfunction: Secondary | ICD-10-CM | POA: Diagnosis not present

## 2024-01-02 DIAGNOSIS — E291 Testicular hypofunction: Secondary | ICD-10-CM

## 2024-01-02 LAB — CBC
HCT: 44.4 % (ref 39.0–52.0)
Hemoglobin: 14.6 g/dL (ref 13.0–17.0)
MCHC: 33 g/dL (ref 30.0–36.0)
MCV: 93.5 fL (ref 78.0–100.0)
Platelets: 314 10*3/uL (ref 150.0–400.0)
RBC: 4.75 Mil/uL (ref 4.22–5.81)
RDW: 14.1 % (ref 11.5–15.5)
WBC: 6.7 10*3/uL (ref 4.0–10.5)

## 2024-01-02 MED ORDER — TESTOSTERONE CYPIONATE 100 MG/ML IM SOLN: 100.0000 mg | INTRAMUSCULAR | 3 refills | Status: DC

## 2024-01-02 MED ORDER — SILDENAFIL CITRATE 100 MG PO TABS: 50.0000 mg | ORAL_TABLET | ORAL | 4 refills | Status: DC | PRN

## 2024-01-02 NOTE — Progress Notes (Signed)
Established Patient Office Visit   Subjective:  Patient ID: Tyler Ferguson, male    DOB: 08-30-1966  Age: 58 y.o. MRN: [id number]  Chief Complaint  Patient presents with   Medical Management of Chronic Issues    Follow up and med refill for Sildenfil and testosterone.     HPI Encounter Diagnoses  Name Primary?   Androgen deficiency Yes   Other post-procedural erectile dysfunction    For follow-up of above.  Wife is giving him his biweekly injections.  Continue sildenafil as needed.  Doing well since his laminectomy surgery.  Status post physical therapy.  There are lingering paresthesias is otherwise doing well.   Review of Systems  Constitutional: Negative.   HENT: Negative.    Eyes:  Negative for blurred vision, discharge and redness.  Respiratory: Negative.    Cardiovascular: Negative.   Gastrointestinal:  Negative for abdominal pain.  Genitourinary: Negative.   Musculoskeletal: Negative.  Negative for myalgias.  Skin:  Negative for rash.  Neurological:  Negative for tingling, loss of consciousness and weakness.  Endo/Heme/Allergies:  Negative for polydipsia.     Current Outpatient Medications:    Testosterone Cypionate 100 MG/ML SOLN, Inject 1 1/2 cc into muscle every 14 days., Disp: 20 mL, Rfl: 1   tiZANidine (ZANAFLEX) 4 MG tablet, Take 1 tablet (4 mg total) by mouth every 6 (six) hours as needed for muscle spasms., Disp: 60 tablet, Rfl: 0   sildenafil (VIAGRA) 100 MG tablet, Take 0.5 tablets (50 mg total) by mouth as needed for erectile dysfunction., Disp: 20 tablet, Rfl: 4   testosterone cypionate (DEPOTESTOTERONE CYPIONATE) 100 MG/ML injection, Inject 1 mL (100 mg total) into the muscle every 14 (fourteen) days. For IM use only, Disp: 10 mL, Rfl: 3  Current Facility-Administered Medications:    testosterone cypionate (DEPOTESTOSTERONE CYPIONATE) injection 150 mg, 150 mg, Intramuscular, Q14 Days, Mliss Sax, MD   testosterone cypionate (DEPOTESTOTERONE  CYPIONATE) injection 100 mg, 100 mg, Intramuscular, Q14 Days, Mliss Sax, MD, 100 mg at 01/22/23 1525   testosterone cypionate (DEPOTESTOTERONE CYPIONATE) injection 150 mg, 150 mg, Intramuscular, Q14 Days, Mliss Sax, MD, 100 mg at 12/10/22 1509   Objective:     BP 130/78   Pulse 77   Temp 98.3 F (36.8 C)   Ht 5\' 10"  (1.778 m)   Wt 211 lb 12.8 oz (96.1 kg)   SpO2 98%   BMI 30.39 kg/m    Physical Exam Constitutional:      General: He is not in acute distress.    Appearance: Normal appearance. He is not ill-appearing, toxic-appearing or diaphoretic.  HENT:     Head: Normocephalic and atraumatic.     Right Ear: External ear normal.     Left Ear: External ear normal.  Eyes:     General: No scleral icterus.       Right eye: No discharge.        Left eye: No discharge.     Extraocular Movements: Extraocular movements intact.     Conjunctiva/sclera: Conjunctivae normal.  Cardiovascular:     Rate and Rhythm: Normal rate and regular rhythm.  Pulmonary:     Effort: Pulmonary effort is normal. No respiratory distress.     Breath sounds: Normal breath sounds. No wheezing or rales.  Musculoskeletal:     Thoracic back: No bony tenderness. Normal range of motion.     Lumbar back: No bony tenderness. Normal range of motion.       Back:  Skin:  General: Skin is warm and dry.  Neurological:     Mental Status: He is alert and oriented to person, place, and time.  Psychiatric:        Mood and Affect: Mood normal.        Behavior: Behavior normal.      No results found for any visits on 01/02/24.    The ASCVD Risk score (Arnett DK, et al., 2019) failed to calculate for the following reasons:   Unable to determine if patient is Non-Hispanic African American    Assessment & Plan:   Androgen deficiency -     Testosterone Cypionate; Inject 1 mL (100 mg total) into the muscle every 14 (fourteen) days. For IM use only  Dispense: 10 mL; Refill: 3 -      CBC  Other post-procedural erectile dysfunction -     Sildenafil Citrate; Take 0.5 tablets (50 mg total) by mouth as needed for erectile dysfunction.  Dispense: 20 tablet; Refill: 4    Return in about 5 months (around 06/01/2024) for annual physical.    Mliss Sax, MD

## 2024-01-05 ENCOUNTER — Encounter: Payer: Self-pay | Admitting: Family Medicine

## 2024-01-27 DIAGNOSIS — M25561 Pain in right knee: Secondary | ICD-10-CM | POA: Diagnosis not present

## 2024-01-27 DIAGNOSIS — M1612 Unilateral primary osteoarthritis, left hip: Secondary | ICD-10-CM | POA: Diagnosis not present

## 2024-01-31 IMAGING — DX DG CHEST 2V
2 series · 2 of 2 positions shown · non-contrast
Comparison: Chest radiograph dated 05/27/2022 and CT dated
05/26/2022.

CLINICAL DATA: Hemoptysis.  Right-sided pneumothorax.

EXAM:
CHEST - 2 VIEW

[w chest pa]
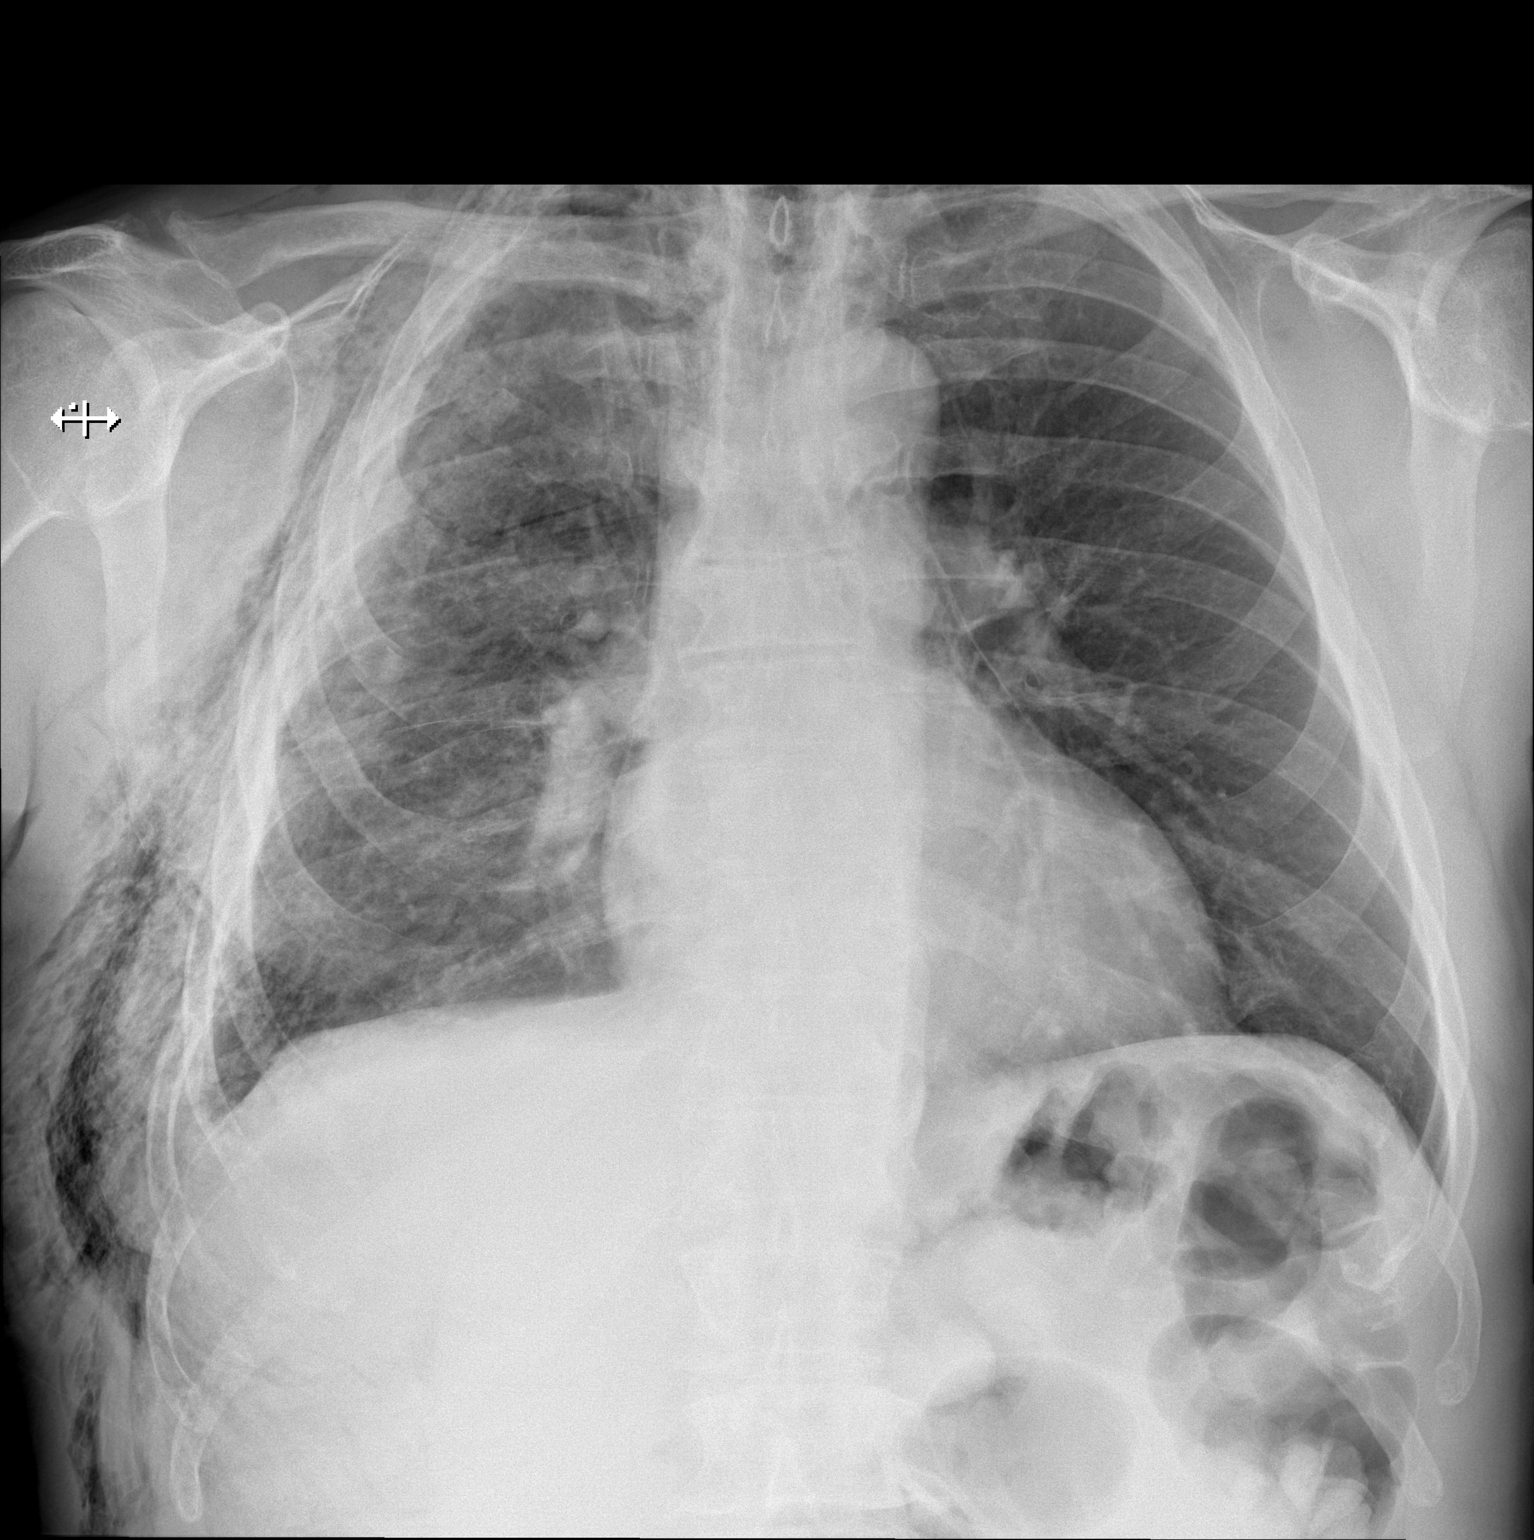

[w chest lat]
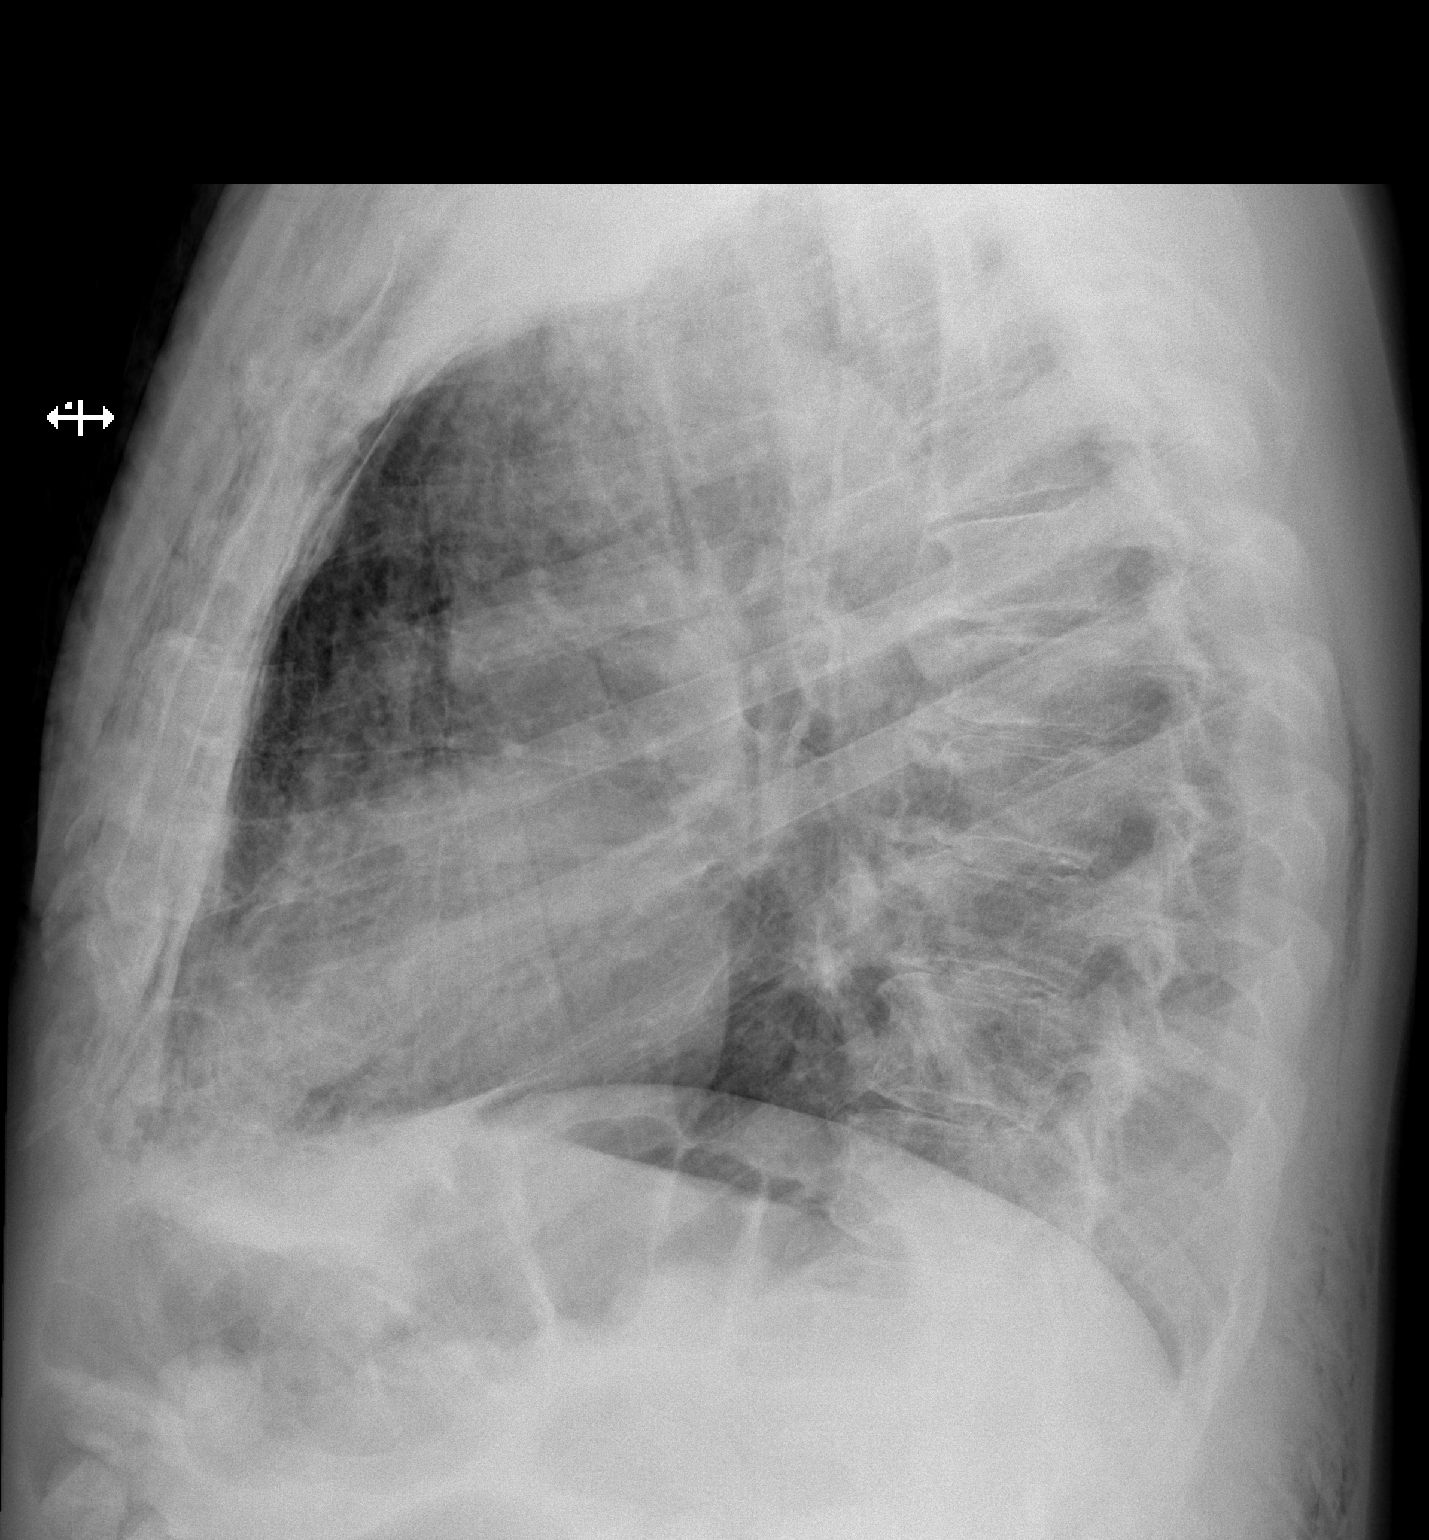

[2 of 2 positions shown; findings below may reference images not displayed]

FINDINGS: Evaluation of the lungs is limited due to extensive subcutaneous
emphysema.

Faint linear lucency in the right upper lobe/right apex suspicious
for pneumothorax and measures approximately 8 mm in thickness and
appears more conspicuous compared to prior radiograph of 05/27/2022.
there is pneumomediastinum. No consolidative changes or pleural
effusion. Stable cardiac silhouette. Interval progression of right
chest wall and neck soft tissue emphysema. Known right rib fractures
better seen on the prior CT.
IMPRESSION: 1. Pneumomediastinum with progression of right chest wall and neck
soft tissue emphysema.
2. Probable small right upper lobe pneumothorax, increased compared
to radiograph of 05/27/2022.
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
## 2024-02-10 ENCOUNTER — Ambulatory Visit: Admitting: Family Medicine

## 2024-02-10 NOTE — Progress Notes (Unsigned)
   Acute Office Visit  Subjective:     Patient ID: Tyler Ferguson, male    DOB: 05/24/1966, 58 y.o.   MRN: [id number]  No chief complaint on file.   HPI Patient is in today for evaluation of ***, for the last ***. Has tried Denies known sick contacts, Denies abdominal pain, nausea, vomiting, diarrhea, rash, fever, chills, other symptoms.  Medical hx as outlined below.  ROS Per HPI      Objective:    There were no vitals taken for this visit.   Physical Exam Vitals and nursing note reviewed.  HENT:     Head: Normocephalic and atraumatic.     Nose: Congestion present.     Mouth/Throat:     Mouth: Mucous membranes are moist.     Pharynx: Oropharynx is clear. No oropharyngeal exudate or posterior oropharyngeal erythema.  Eyes:     Extraocular Movements: Extraocular movements intact.  Cardiovascular:     Rate and Rhythm: Normal rate and regular rhythm.  Pulmonary:     Effort: Pulmonary effort is normal.  Musculoskeletal:     Cervical back: Normal range of motion.  Lymphadenopathy:     Cervical: Cervical adenopathy present.  Neurological:     Mental Status: He is alert.   No results found for any visits on 02/11/24.      Assessment & Plan:  ***  No orders of the defined types were placed in this encounter.   No follow-ups on file.  Moshe Cipro, FNP

## 2024-02-11 ENCOUNTER — Ambulatory Visit: Admitting: Family Medicine

## 2024-02-11 ENCOUNTER — Encounter: Payer: Self-pay | Admitting: Family Medicine

## 2024-02-11 VITALS — BP 128/70 | HR 67 | Temp 98.0°F | Ht 70.0 in | Wt 212.2 lb

## 2024-02-11 DIAGNOSIS — B9689 Other specified bacterial agents as the cause of diseases classified elsewhere: Secondary | ICD-10-CM | POA: Diagnosis not present

## 2024-02-11 DIAGNOSIS — R051 Acute cough: Secondary | ICD-10-CM | POA: Diagnosis not present

## 2024-02-11 DIAGNOSIS — M25552 Pain in left hip: Secondary | ICD-10-CM | POA: Diagnosis not present

## 2024-02-11 DIAGNOSIS — R0981 Nasal congestion: Secondary | ICD-10-CM

## 2024-02-11 DIAGNOSIS — R062 Wheezing: Secondary | ICD-10-CM

## 2024-02-11 DIAGNOSIS — J019 Acute sinusitis, unspecified: Secondary | ICD-10-CM

## 2024-02-11 MED ORDER — PREDNISONE 10 MG (21) PO TBPK: ORAL_TABLET | Freq: Every day | ORAL | 0 refills | Status: AC

## 2024-02-11 MED ORDER — AZITHROMYCIN 250 MG PO TABS: ORAL_TABLET | ORAL | 0 refills | Status: AC

## 2024-02-11 NOTE — Patient Instructions (Signed)
 I have sent in azithromycin for you to take.  Take 2 tablets today, then 1 tablet daily for the next 4 days.  Take by mouth daily for 6 days. Take 6 tablets on day 1, 5 tablets on day 2, 4 tablets on day 3, 3 tablets on day 4, 2 tablets on day 5, 1 tablet on day 6  Follow-up with me for new or worsening symptoms.

## 2024-06-02 ENCOUNTER — Telehealth: Payer: Self-pay

## 2024-06-02 ENCOUNTER — Other Ambulatory Visit (HOSPITAL_COMMUNITY): Payer: Self-pay

## 2024-06-02 DIAGNOSIS — E291 Testicular hypofunction: Secondary | ICD-10-CM

## 2024-06-02 NOTE — Telephone Encounter (Signed)
 Pharmacy Patient Advocate Encounter   Received notification from CoverMyMeds that prior authorization for Testosterone  Cypionate 100MG /ML intramuscular solution  is required/requested.   Insurance verification completed.   The patient is insured through [facility] .   Per test claim: PA required; PA started via CoverMyMeds. KEY [id number] . Waiting for clinical questions to populate.

## 2024-06-02 NOTE — Telephone Encounter (Signed)
 Copied from CRM [id number]. Topic: Clinical - Prescription Issue >> Jun 02, 2024  4:40 PM Berneda FALCON wrote: Reason for CRM: Pt calling in to check on the status of the previous CRM sent for his testosterone  refill. I let him know that this is something that was sent to the PCP and we are awaiting the response.

## 2024-06-02 NOTE — Telephone Encounter (Signed)
 Copied from CRM [id number]. Topic: Clinical - Medication Question >> Jun 02, 2024  2:06 PM Tyler Ferguson wrote: Reason for CRM: Patient updated his pharmacy to [facility] [place], [place] - [address] [phone]. They do not have Testosterone  Cypionate 100 MG/ML SOLN [[id number]] in stock and patient would like to know if a different prescription can be sent because they do have the 20 mg in stock. Patient would also like to speak with Dr.Kremer's medical assistance if possible.

## 2024-06-03 MED ORDER — TESTOSTERONE CYPIONATE 200 MG/ML IJ SOLN: 150.0000 mg | INTRAMUSCULAR | 1 refills | Status: DC

## 2024-06-03 NOTE — Addendum Note (Signed)
 Addended by: BERNETA ELSIE LABOR on: 06/03/2024 05:26 PM   Modules accepted: Orders

## 2024-06-03 NOTE — Telephone Encounter (Unsigned)
 Copied from CRM [id number]. Topic: Clinical - Prescription Issue >> Jun 02, 2024  4:40 PM Berneda FALCON wrote: Reason for CRM: Pt calling in to check on the status of the previous CRM sent for his testosterone  refill. I let him know that this is something that was sent to the PCP and we are awaiting the response. >> Jun 03, 2024  4:13 PM Rosina D wrote: Patient calling in to check on the status of his previous message. The patient stated his current dose is on back order so he need an increase in the dosage of the medication to 200mg . The patient stated he can get the 200mg  dosage of testosterone . Patient was suppose to take the medication on Tuesday CB [phone] >> Jun 03, 2024 10:14 AM Thersia BROCKS wrote: Patient called in would like for [facility] to give him a callback regarding the prescription

## 2024-06-09 DIAGNOSIS — M25552 Pain in left hip: Secondary | ICD-10-CM | POA: Diagnosis not present

## 2024-06-21 DIAGNOSIS — M25551 Pain in right hip: Secondary | ICD-10-CM | POA: Diagnosis not present

## 2024-06-22 ENCOUNTER — Telehealth: Payer: Self-pay

## 2024-06-22 NOTE — Telephone Encounter (Signed)
 Pharmacy Patient Advocate Encounter   Received notification from CoverMyMeds that prior authorization for Testosterone  Cypionate 100MG /ML intramuscular solution  is required/requested.   Insurance verification completed.   The patient is insured through [facility] .   Per test claim: PA required; PA submitted to above mentioned insurance via CoverMyMeds Key/confirmation #/EOC [id number] Status is pending

## 2024-06-23 NOTE — Telephone Encounter (Signed)
 CMM cancelled request, renewed the request. New CMM # [id number]

## 2024-06-30 NOTE — Telephone Encounter (Signed)
 Pharmacy Patient Advocate Encounter  Received notification from [facility] that Prior Authorization for Testosterone  Cypionate 100MG /ML intramuscular solution  has been DENIED.  Full denial letter will be uploaded to the media tab. See denial reason below.   PA #/Case ID/Reference #: [id number]

## 2024-08-10 ENCOUNTER — Telehealth: Payer: Self-pay

## 2024-08-10 ENCOUNTER — Other Ambulatory Visit (HOSPITAL_COMMUNITY): Payer: Self-pay

## 2024-08-10 DIAGNOSIS — E291 Testicular hypofunction: Secondary | ICD-10-CM

## 2024-08-10 NOTE — Telephone Encounter (Signed)
 Copied from CRM [id number]. Topic: Clinical - Prescription Issue >> Aug 10, 2024  7:45 AM Mercedes MATSU wrote: Reason for CRM: Patient called in asking for Dr. Berneta to appeal his prior authorization for Testerone 200mg /ml. Patient requesting a call back once appeal is completed. He states his medication was denied and he's been on it for years and he would like to try submitting the rx again.  CB: [phone]

## 2024-08-11 ENCOUNTER — Telehealth: Payer: Self-pay

## 2024-08-11 ENCOUNTER — Other Ambulatory Visit (HOSPITAL_COMMUNITY): Payer: Self-pay

## 2024-08-11 NOTE — Telephone Encounter (Signed)
 Please clarify sig and Rx strength

## 2024-08-12 ENCOUNTER — Other Ambulatory Visit (HOSPITAL_COMMUNITY): Payer: Self-pay

## 2024-08-12 DIAGNOSIS — M1711 Unilateral primary osteoarthritis, right knee: Secondary | ICD-10-CM | POA: Diagnosis not present

## 2024-08-12 MED ORDER — TESTOSTERONE CYPIONATE 100 MG/ML IM SOLN: 150.0000 mg | INTRAMUSCULAR | 3 refills | Status: DC

## 2024-08-12 NOTE — Telephone Encounter (Signed)
 Hi Dr Berneta,   Please See below and clarify the sig and Rx strength before we can proceed. I notice the strength and sig differ. Please advise and adjust RX if appropriate

## 2024-08-12 NOTE — Telephone Encounter (Signed)
 Pharmacy Patient Advocate Encounter  Received notification from [facility] that Prior Authorization for Testosterone  Cypionate 100MG /ML intramuscular solution has been APPROVED  to 9.4.28. Ran test claim, Copay is $10.00. This test claim was processed through [facility]- copay amounts may vary at other pharmacies due to pharmacy/plan contracts, or as the patient moves through the different stages of their insurance plan.   PA #/Case ID/Reference #: [id number]

## 2024-08-12 NOTE — Telephone Encounter (Signed)
 P/a approved. Cost is around/about 10.00. Nothing further needed

## 2024-08-12 NOTE — Addendum Note (Signed)
 Addended by: BERNETA ELSIE LABOR on: 08/12/2024 12:39 PM   Modules accepted: Orders

## 2024-08-12 NOTE — Telephone Encounter (Signed)
 Afterwards we can attempt to resubmit or appeal.

## 2024-08-16 ENCOUNTER — Other Ambulatory Visit (HOSPITAL_COMMUNITY): Payer: Self-pay

## 2024-09-17 ENCOUNTER — Ambulatory Visit: Admitting: Family Medicine

## 2024-10-11 ENCOUNTER — Encounter: Payer: Self-pay | Admitting: Family Medicine

## 2024-10-11 ENCOUNTER — Ambulatory Visit (INDEPENDENT_AMBULATORY_CARE_PROVIDER_SITE_OTHER): Admitting: Family Medicine

## 2024-10-11 VITALS — BP 138/82 | HR 97 | Temp 98.3°F | Ht 70.0 in | Wt 215.6 lb

## 2024-10-11 DIAGNOSIS — Z131 Encounter for screening for diabetes mellitus: Secondary | ICD-10-CM

## 2024-10-11 DIAGNOSIS — E291 Testicular hypofunction: Secondary | ICD-10-CM | POA: Diagnosis not present

## 2024-10-11 DIAGNOSIS — Z13220 Encounter for screening for lipoid disorders: Secondary | ICD-10-CM | POA: Diagnosis not present

## 2024-10-11 DIAGNOSIS — N319 Neuromuscular dysfunction of bladder, unspecified: Secondary | ICD-10-CM | POA: Diagnosis not present

## 2024-10-11 DIAGNOSIS — F1090 Alcohol use, unspecified, uncomplicated: Secondary | ICD-10-CM | POA: Diagnosis not present

## 2024-10-11 DIAGNOSIS — R030 Elevated blood-pressure reading, without diagnosis of hypertension: Secondary | ICD-10-CM | POA: Diagnosis not present

## 2024-10-11 DIAGNOSIS — Z0000 Encounter for general adult medical examination without abnormal findings: Secondary | ICD-10-CM | POA: Diagnosis not present

## 2024-10-11 LAB — COMPREHENSIVE METABOLIC PANEL WITH GFR
ALT: 21 U/L (ref 0–53)
AST: 22 U/L (ref 0–37)
Albumin: 4.2 g/dL (ref 3.5–5.2)
Alkaline Phosphatase: 60 U/L (ref 39–117)
BUN: 13 mg/dL (ref 6–23)
CO2: 30 meq/L (ref 19–32)
Calcium: 9.3 mg/dL (ref 8.4–10.5)
Chloride: 99 meq/L (ref 96–112)
Creatinine, Ser: 0.79 mg/dL (ref 0.40–1.50)
GFR: 96.46 mL/min (ref >60.00)
Glucose, Bld: 89 mg/dL (ref 70–99)
Potassium: 4.9 meq/L (ref 3.5–5.1)
Sodium: 136 meq/L (ref 135–145)
Total Bilirubin: 0.6 mg/dL (ref 0.2–1.2)
Total Protein: 6.9 g/dL (ref 6.0–8.3)

## 2024-10-11 LAB — CBC WITH DIFFERENTIAL/PLATELET
Basophils Absolute: 0.1 K/uL (ref 0.0–0.1)
Basophils Relative: 1.1 % (ref 0.0–3.0)
Eosinophils Absolute: 0.3 K/uL (ref 0.0–0.7)
Eosinophils Relative: 4.9 % (ref 0.0–5.0)
HCT: 45.8 % (ref 39.0–52.0)
Hemoglobin: 15.3 g/dL (ref 13.0–17.0)
Lymphocytes Relative: 27.9 % (ref 12.0–46.0)
Lymphs Abs: 1.7 K/uL (ref 0.7–4.0)
MCHC: 33.3 g/dL (ref 30.0–36.0)
MCV: 94.3 fl (ref 78.0–100.0)
Monocytes Absolute: 0.6 K/uL (ref 0.1–1.0)
Monocytes Relative: 9.8 % (ref 3.0–12.0)
Neutro Abs: 3.3 K/uL (ref 1.4–7.7)
Neutrophils Relative %: 56.3 % (ref 43.0–77.0)
Platelets: 286 K/uL (ref 150.0–400.0)
RBC: 4.85 Mil/uL (ref 4.22–5.81)
RDW: 13.7 % (ref 11.5–15.5)
WBC: 5.9 K/uL (ref 4.0–10.5)

## 2024-10-11 LAB — HEMOGLOBIN A1C: Hgb A1c MFr Bld: 5.8 % (ref 4.6–6.5)

## 2024-10-11 LAB — LIPID PANEL
Cholesterol: 190 mg/dL (ref 0–200)
HDL: 65.8 mg/dL (ref >39.00)
LDL Cholesterol: 115 mg/dL — ABNORMAL HIGH (ref 0–99)
NonHDL: 124.11
Total CHOL/HDL Ratio: 3
Triglycerides: 47 mg/dL (ref 0.0–149.0)
VLDL: 9.4 mg/dL (ref 0.0–40.0)

## 2024-10-11 LAB — URINALYSIS, ROUTINE W REFLEX MICROSCOPIC
Bilirubin Urine: NEGATIVE
Hgb urine dipstick: NEGATIVE
Ketones, ur: NEGATIVE
Leukocytes,Ua: NEGATIVE
Nitrite: NEGATIVE
RBC / HPF: NONE SEEN (ref 0–?)
Specific Gravity, Urine: <=1.005 — AB (ref 1.000–1.030)
Total Protein, Urine: NEGATIVE
Urine Glucose: NEGATIVE
Urobilinogen, UA: 0.2 (ref 0.0–1.0)
WBC, UA: NONE SEEN (ref 0–?)
pH: 6.5 (ref 5.0–8.0)

## 2024-10-11 LAB — PSA: PSA: 1.22 ng/mL (ref 0.10–4.00)

## 2024-10-11 MED ORDER — TESTOSTERONE CYPIONATE 100 MG/ML IM SOLN: 150.0000 mg | INTRAMUSCULAR | 3 refills | Status: AC

## 2024-10-11 NOTE — Progress Notes (Signed)
 Established Patient Office Visit   Subjective:  Patient ID: Tyler Ferguson, male    DOB: [birth year]  Age: 58 y.o. MRN: [id number]  No chief complaint on file.   HPI Encounter Diagnoses  Name Primary?   Healthcare maintenance Yes   Androgen deficiency    Neurogenic bladder disorder    Screening for cholesterol level    Screening for diabetes mellitus    Elevated BP without diagnosis of hypertension    Alcohol use    Here for physical and follow-up of above.  Seeing Ortho.  For ongoing right hip and knee pain.  Blood pressure has been up-and-down.  Continues biweekly testosterone  therapy.  No regular exercise.  Consumes alcohol 1-2 times weekly.  May have 5-6 servings in a given setting.    Review of Systems  Constitutional: Negative.   HENT: Negative.    Eyes:  Negative for blurred vision, discharge and redness.  Respiratory: Negative.    Cardiovascular: Negative.   Gastrointestinal:  Negative for abdominal pain.  Genitourinary: Negative.   Musculoskeletal:  Positive for joint pain. Negative for myalgias.  Skin:  Negative for rash.  Neurological:  Negative for tingling, loss of consciousness and weakness.  Endo/Heme/Allergies:  Negative for polydipsia.      10/11/2024    8:23 AM 06/06/2023    1:28 PM 05/22/2023    1:45 PM  Depression screen PHQ 2/9  Decreased Interest 0 0 0  Down, Depressed, Hopeless 0 0 0  PHQ - 2 Score 0 0 0  Altered sleeping 0    Tired, decreased energy 0    Change in appetite 0    Feeling bad or failure about yourself  0    Trouble concentrating 0    Moving slowly or fidgety/restless 0    Suicidal thoughts 0    PHQ-9 Score 0    Difficult doing work/chores Not difficult at all        Current Outpatient Medications:    sildenafil  (VIAGRA ) 100 MG tablet, Take 0.5 tablets (50 mg total) by mouth as needed for erectile dysfunction., Disp: 20 tablet, Rfl: 4   testosterone  cypionate (DEPOTESTOTERONE CYPIONATE) 100 MG/ML injection, Inject 1.5 mLs  (150 mg total) into the muscle every 14 (fourteen) days. For IM use only, Disp: 10 mL, Rfl: 3   Objective:     BP 138/82 (BP Location: Left Arm, Patient Position: Sitting, Cuff Size: Large)   Pulse 97   Temp 98.3 F (36.8 C) (Temporal)   Ht 5' 10 (1.778 m)   Wt 215 lb 9.6 oz (97.8 kg)   SpO2 97%   BMI 30.94 kg/m  BP Readings from Last 3 Encounters:  10/11/24 138/82  02/11/24 128/70  01/02/24 130/78   Wt Readings from Last 3 Encounters:  10/11/24 215 lb 9.6 oz (97.8 kg)  02/11/24 212 lb 3.2 oz (96.3 kg)  01/02/24 211 lb 12.8 oz (96.1 kg)      Physical Exam Constitutional:      General: He is not in acute distress.    Appearance: Normal appearance. He is not ill-appearing, toxic-appearing or diaphoretic.  HENT:     Head: Normocephalic and atraumatic.     Right Ear: Tympanic membrane, ear canal and external ear normal.     Left Ear: Tympanic membrane, ear canal and external ear normal.     Mouth/Throat:     Mouth: Mucous membranes are moist.     Pharynx: Oropharynx is clear. No oropharyngeal exudate or posterior oropharyngeal erythema.  Eyes:  General: No scleral icterus.       Right eye: No discharge.        Left eye: No discharge.     Extraocular Movements: Extraocular movements intact.     Conjunctiva/sclera: Conjunctivae normal.     Pupils: Pupils are equal, round, and reactive to light.  Cardiovascular:     Rate and Rhythm: Normal rate and regular rhythm.  Pulmonary:     Effort: Pulmonary effort is normal. No respiratory distress.     Breath sounds: Normal breath sounds. No wheezing or rales.  Abdominal:     General: Bowel sounds are normal.     Tenderness: There is no abdominal tenderness. There is no guarding or rebound.  Musculoskeletal:     Cervical back: No rigidity or tenderness.  Skin:    General: Skin is warm and dry.  Neurological:     Mental Status: He is alert and oriented to person, place, and time.  Psychiatric:        Mood and Affect:  Mood normal.        Behavior: Behavior normal.      No results found for any visits on 10/11/24.    The ASCVD Risk score (Arnett DK, et al., 2019) failed to calculate for the following reasons:   Unable to determine if patient is Non-Hispanic African American    Assessment & Plan:   Healthcare maintenance  Androgen deficiency -     Testosterone  Cypionate; Inject 1.5 mLs (150 mg total) into the muscle every 14 (fourteen) days. For IM use only  Dispense: 10 mL; Refill: 3 -     CBC with Differential/Platelet  Neurogenic bladder disorder -     PSA -     Urinalysis, Routine w reflex microscopic  Screening for cholesterol level -     Comprehensive metabolic panel with GFR -     Lipid panel  Screening for diabetes mellitus -     Comprehensive metabolic panel with GFR -     Hemoglobin A1c  Elevated BP without diagnosis of hypertension  Alcohol use    Return in about 3 months (around 01/11/2025).  Information was given on taking your blood pressure and preventing hypertension.  He will check and record periodically up in 3 months.  Recommended that he exercise for 30 minutes most days weekly.  Recommended that he consume no more than 1 or 2 servings of alcohol on a given day.  Elsie Sim Lent, MD

## 2024-10-12 ENCOUNTER — Ambulatory Visit: Payer: Self-pay | Admitting: Family Medicine

## 2024-10-12 DIAGNOSIS — M1711 Unilateral primary osteoarthritis, right knee: Secondary | ICD-10-CM | POA: Diagnosis not present

## 2024-10-13 DIAGNOSIS — M25552 Pain in left hip: Secondary | ICD-10-CM | POA: Diagnosis not present

## 2024-10-14 NOTE — Telephone Encounter (Signed)
 Copied from CRM #[id number]. Topic: Clinical - Lab/Test Results >> Oct 14, 2024  9:48 AM Revonda D wrote: Reason for CRM: Pt is requesting to speak with Dr.Kremer or his nurse in regards to his labs results. Pt would like a callback today if possible.

## 2024-10-18 NOTE — Telephone Encounter (Unsigned)
 Copied from CRM #[id number]. Topic: Clinical - Lab/Test Results >> Oct 18, 2024  9:37 AM Eva FALCON wrote: Reason for CRM: Pt is returning call in regards to lab results, he read what the provider said on MyChart, but has further questions, he called in on 11/6 and has not heard anything back. Really is requesting a call back.

## 2024-10-18 NOTE — Telephone Encounter (Signed)
 3rd attempt to contact patient. Pt picked up the phone and then call dropped before I could verify DOB. If pt calls back please get whatever question he has and forward to me. [phone]      Copied from CRM #[id number]. Topic: General - Other >> Oct 18, 2024  9:59 AM Berneda FALCON wrote: Reason for CRM: Patient is returning missed call from [facility] about his lab results. Called the CAL and spoke to [place] who states she is about to room another patient and will call patient right back.  Patient callback is [phone] (home[place]  [place] so please call back multiple times if he does not answer or if it goes straight to vm.

## 2024-10-29 ENCOUNTER — Ambulatory Visit: Admitting: Family Medicine

## 2024-11-08 DIAGNOSIS — M25561 Pain in right knee: Secondary | ICD-10-CM | POA: Diagnosis not present

## 2024-11-11 ENCOUNTER — Ambulatory Visit: Admitting: Family Medicine

## 2024-11-11 DIAGNOSIS — M1711 Unilateral primary osteoarthritis, right knee: Secondary | ICD-10-CM | POA: Diagnosis not present

## 2024-11-18 DIAGNOSIS — M1711 Unilateral primary osteoarthritis, right knee: Secondary | ICD-10-CM | POA: Diagnosis not present

## 2024-11-25 DIAGNOSIS — M1711 Unilateral primary osteoarthritis, right knee: Secondary | ICD-10-CM | POA: Diagnosis not present

## 2024-12-29 ENCOUNTER — Telehealth: Payer: Self-pay

## 2024-12-29 NOTE — Telephone Encounter (Signed)
 Pt walked in the office stating he was told he come to the office to have his testosterone  labs collected. No notes or orders in the chart from PCP instructing to have labs collected. Advises the lab staff that pt is scheduled for FU with PCP in February. If PCP wants testocerone collected, he will order at that visit.

## 2025-01-06 ENCOUNTER — Other Ambulatory Visit: Payer: Self-pay

## 2025-01-06 DIAGNOSIS — N5239 Other post-surgical erectile dysfunction: Secondary | ICD-10-CM

## 2025-01-06 MED ORDER — SILDENAFIL CITRATE 100 MG PO TABS: 50.0000 mg | ORAL_TABLET | ORAL | 4 refills | Status: AC | PRN

## 2025-01-06 NOTE — Telephone Encounter (Signed)
 Refil request for  Viagra  100 mg LR 01/02/24, #20, 4 rf LOV 10/11/24 FOV 01/14/25  Please review and advise  Thanks. Dm/cma

## 2025-01-06 NOTE — Telephone Encounter (Signed)
 Copied from CRM [id number]. Topic: Clinical - Medication Refill >> Jan 06, 2025  4:12 PM [name] wrote: Medication: sildenafil  (VIAGRA ) 100 MG tablet  Has the patient contacted their pharmacy? Yes (Agent: If no, request that the patient contact the pharmacy for the refill. If patient does not wish to contact the pharmacy document the reason why and proceed with request.) (Agent: If yes, when and what did the pharmacy advise?)  This is the patient's preferred pharmacy:  [facility] - [place], [place] - [address] [place] [address] Phone: [phone] Fax: [phone]    Is this the correct pharmacy for this prescription? Yes If no, delete pharmacy and type the correct one.   Has the prescription been filled recently? No  Is the patient out of the medication? Yes  Has the patient been seen for an appointment in the last year OR does the patient have an upcoming appointment? Yes  Can we respond through MyChart? Yes  Agent: Please be advised that Rx refills may take up to 3 business days. We ask that you follow-up with your pharmacy.

## 2025-01-14 ENCOUNTER — Ambulatory Visit: Admitting: Family Medicine

## 2025-01-14 ENCOUNTER — Encounter: Payer: Self-pay | Admitting: Family Medicine

## 2025-01-14 ENCOUNTER — Other Ambulatory Visit: Payer: Self-pay

## 2025-01-14 VITALS — BP 130/70 | HR 68 | Temp 97.9°F | Ht 70.0 in | Wt 220.0 lb

## 2025-01-14 DIAGNOSIS — E291 Testicular hypofunction: Secondary | ICD-10-CM

## 2025-01-14 DIAGNOSIS — Z131 Encounter for screening for diabetes mellitus: Secondary | ICD-10-CM

## 2025-01-14 LAB — HEMOGLOBIN A1C: Hgb A1c MFr Bld: 5.4 % (ref 4.6–6.5)

## 2025-01-14 NOTE — Progress Notes (Signed)
 "  Established Patient Office Visit   Subjective:  Patient ID: Tyler Ferguson, male    DOB: 11-06-1966  Age: 59 y.o. MRN: [id number]  Chief Complaint  Patient presents with   Medical Management of Chronic Issues    Three month follow-up     HPI Encounter Diagnoses  Name Primary?   Androgen deficiency Yes   Screening for diabetes mellitus    For follow up of above.  Continues with the 150 mg of testosterone  cypionate every 2 weeks.  Last injection was 1 week ago.  He had a sister with juvenile diabetes who passed at age 61.  There is otherwise no known family history of diabetes.  He is exercising by using a rowing machine for 30 minutes 4 times weekly.   Review of Systems  Constitutional: Negative.   HENT: Negative.    Eyes:  Negative for blurred vision, discharge and redness.  Respiratory: Negative.    Cardiovascular: Negative.   Gastrointestinal:  Negative for abdominal pain.  Genitourinary: Negative.   Musculoskeletal: Negative.  Negative for myalgias.  Skin:  Negative for rash.  Neurological:  Negative for tingling, loss of consciousness and weakness.  Endo/Heme/Allergies:  Negative for polydipsia.    Current Medications[1]   Objective:     BP 130/70   Pulse 68   Temp 97.9 F (36.6 C)   Ht 5' 10 (1.778 m)   Wt 220 lb (99.8 kg)   SpO2 99%   BMI 31.57 kg/m  BP Readings from Last 3 Encounters:  01/14/25 130/70  10/11/24 138/82  02/11/24 128/70   Wt Readings from Last 3 Encounters:  01/14/25 220 lb (99.8 kg)  10/11/24 215 lb 9.6 oz (97.8 kg)  02/11/24 212 lb 3.2 oz (96.3 kg)      Physical Exam Constitutional:      General: He is not in acute distress.    Appearance: Normal appearance. He is not ill-appearing, toxic-appearing or diaphoretic.  HENT:     Head: Normocephalic and atraumatic.     Right Ear: External ear normal.     Left Ear: External ear normal.  Eyes:     General: No scleral icterus.       Right eye: No discharge.        Left eye: No  discharge.     Extraocular Movements: Extraocular movements intact.     Conjunctiva/sclera: Conjunctivae normal.  Pulmonary:     Effort: Pulmonary effort is normal. No respiratory distress.  Skin:    General: Skin is warm and dry.  Neurological:     Mental Status: He is alert and oriented to person, place, and time.  Psychiatric:        Mood and Affect: Mood normal.        Behavior: Behavior normal.      No results found for any visits on 01/14/25.    The ASCVD Risk score (Arnett DK, et al., 2019) failed to calculate for the following reasons:   Unable to determine if patient is Non-Hispanic African American    Assessment & Plan:   Androgen deficiency -     Testosterone  Total,Free,Bio, Males  Screening for diabetes mellitus -     Hemoglobin A1c    Return Will follow-up with Dr. Thedora in November for physical.  Encouraged regular exercise for 150 minutes weekly and weight loss for the prevention of type 2 diabetes.  Information was given on preventing type 2 diabetes.  Checking testosterone  levels today.  Elsie Sim Lent, MD    [  1]  Current Outpatient Medications:    sildenafil  (VIAGRA ) 100 MG tablet, Take 0.5 tablets (50 mg total) by mouth as needed for erectile dysfunction., Disp: 20 tablet, Rfl: 4   testosterone  cypionate (DEPOTESTOTERONE CYPIONATE) 100 MG/ML injection, Inject 1.5 mLs (150 mg total) into the muscle every 14 (fourteen) days. For IM use only, Disp: 10 mL, Rfl: 3  "

## 2025-10-14 ENCOUNTER — Encounter: Admitting: Family Medicine
